# Patient Record
Sex: Female | Born: 1960 | Race: White | Hispanic: No | Smoking: Former smoker
Health system: Southern US, Community
[De-identification: ages and names within clinical notes are randomized; demographics above are authoritative.]

## PROBLEM LIST (undated history)

## (undated) DIAGNOSIS — M310 Hypersensitivity angiitis: Secondary | ICD-10-CM

## (undated) DIAGNOSIS — M898X9 Other specified disorders of bone, unspecified site: Secondary | ICD-10-CM

## (undated) DIAGNOSIS — M797 Fibromyalgia: Secondary | ICD-10-CM

## (undated) DIAGNOSIS — N809 Endometriosis, unspecified: Secondary | ICD-10-CM

## (undated) DIAGNOSIS — M7750 Other enthesopathy of unspecified foot: Secondary | ICD-10-CM

## (undated) DIAGNOSIS — I10 Essential (primary) hypertension: Secondary | ICD-10-CM

## (undated) DIAGNOSIS — E7800 Pure hypercholesterolemia, unspecified: Secondary | ICD-10-CM

## (undated) DIAGNOSIS — F319 Bipolar disorder, unspecified: Secondary | ICD-10-CM

## (undated) DIAGNOSIS — N186 End stage renal disease: Secondary | ICD-10-CM

## (undated) DIAGNOSIS — J449 Chronic obstructive pulmonary disease, unspecified: Secondary | ICD-10-CM

## (undated) DIAGNOSIS — M1990 Unspecified osteoarthritis, unspecified site: Secondary | ICD-10-CM

## (undated) HISTORY — DX: Other enthesopathy of unspecified foot and ankle: M77.50

## (undated) HISTORY — PX: TUBAL LIGATION: SHX77

## (undated) HISTORY — DX: Other specified disorders of bone, unspecified site: M89.8X9

## (undated) HISTORY — PX: CARPAL TUNNEL RELEASE: SHX101

## (undated) HISTORY — PX: BREAST LUMPECTOMY: SHX2

## (undated) HISTORY — PX: KNEE SURGERY: SHX244

## (undated) HISTORY — PX: CLAVICLE SURGERY: SHX598

## (undated) HISTORY — DX: Endometriosis, unspecified: N80.9

---

## 1993-06-21 HISTORY — PX: APPENDECTOMY: SHX54

## 2003-01-25 ENCOUNTER — Ambulatory Visit (HOSPITAL_BASED_OUTPATIENT_CLINIC_OR_DEPARTMENT_OTHER): Admission: RE | Admit: 2003-01-25 | Discharge: 2003-01-25 | Payer: Self-pay | Admitting: Orthopedic Surgery

## 2003-09-16 ENCOUNTER — Ambulatory Visit (HOSPITAL_COMMUNITY): Admission: RE | Admit: 2003-09-16 | Discharge: 2003-09-16 | Payer: Self-pay | Admitting: Orthopedic Surgery

## 2011-06-24 LAB — COMPREHENSIVE METABOLIC PANEL
Albumin: 3.4 g/dL (ref 3.4–5.0)
Alkaline Phosphatase: 103 U/L (ref 50–136)
Anion Gap: 6 — ABNORMAL LOW (ref 7–16)
Calcium, Total: 8.9 mg/dL (ref 8.5–10.1)
Creatinine: 0.86 mg/dL (ref 0.60–1.30)
EGFR (African American): >60
EGFR (Non-African Amer.): >60
Glucose: 121 mg/dL — ABNORMAL HIGH (ref 65–99)
Potassium: 3 mmol/L — ABNORMAL LOW (ref 3.5–5.1)
SGPT (ALT): 26 U/L
Sodium: 137 mmol/L (ref 136–145)
Total Protein: 8 g/dL (ref 6.4–8.2)

## 2011-06-24 LAB — URINALYSIS, COMPLETE
Glucose,UR: NEGATIVE mg/dL (ref 0–75)
Ketone: NEGATIVE
Leukocyte Esterase: NEGATIVE
Nitrite: NEGATIVE
Protein: NEGATIVE
RBC,UR: 1 /HPF (ref 0–5)
Specific Gravity: 1.005 (ref 1.003–1.030)
WBC UR: NONE SEEN /HPF (ref 0–5)

## 2011-06-24 LAB — DRUG SCREEN, URINE
Amphetamines, Ur Screen: NEGATIVE (ref ?–1000)
Barbiturates, Ur Screen: NEGATIVE (ref ?–200)
Cannabinoid 50 Ng, Ur ~~LOC~~: NEGATIVE (ref ?–50)
Cocaine Metabolite,Ur ~~LOC~~: NEGATIVE (ref ?–300)
MDMA (Ecstasy)Ur Screen: NEGATIVE (ref ?–500)
Opiate, Ur Screen: POSITIVE (ref ?–300)

## 2011-06-24 LAB — TSH: Thyroid Stimulating Horm: 0.463 u[IU]/mL

## 2011-06-25 ENCOUNTER — Inpatient Hospital Stay: Payer: Self-pay | Admitting: Psychiatry

## 2011-06-25 LAB — CBC
HCT: 40.5 % (ref 35.0–47.0)
HGB: 13.5 g/dL (ref 12.0–16.0)
MCH: 32.3 pg (ref 26.0–34.0)
MCHC: 33.4 g/dL (ref 32.0–36.0)
Platelet: 149 10*3/uL — ABNORMAL LOW (ref 150–440)
RBC: 4.2 10*6/uL (ref 3.80–5.20)

## 2011-06-26 LAB — BEHAVIORAL MEDICINE 1 PANEL
Albumin: 2.9 g/dL — ABNORMAL LOW (ref 3.4–5.0)
Alkaline Phosphatase: 87 U/L (ref 50–136)
Anion Gap: 7 (ref 7–16)
Basophil #: 0 10*3/uL (ref 0.0–0.1)
Basophil %: 0.9 %
Bilirubin,Total: 0.3 mg/dL (ref 0.2–1.0)
Creatinine: 0.92 mg/dL (ref 0.60–1.30)
Eosinophil #: 0.2 10*3/uL (ref 0.0–0.7)
Eosinophil %: 4.3 %
HGB: 13 g/dL (ref 12.0–16.0)
Lymphocyte #: 1.1 10*3/uL (ref 1.0–3.6)
MCH: 32.2 pg (ref 26.0–34.0)
MCHC: 33.4 g/dL (ref 32.0–36.0)
Monocyte #: 0.4 10*3/uL (ref 0.0–0.7)
Neutrophil #: 2.8 10*3/uL (ref 1.4–6.5)
Osmolality: 279 (ref 275–301)
Platelet: 146 10*3/uL — ABNORMAL LOW (ref 150–440)
Potassium: 3.3 mmol/L — ABNORMAL LOW (ref 3.5–5.1)
RBC: 4.03 10*6/uL (ref 3.80–5.20)
SGOT(AST): 27 U/L (ref 15–37)
Sodium: 140 mmol/L (ref 136–145)
Thyroid Stimulating Horm: 0.127 u[IU]/mL — ABNORMAL LOW
Total Protein: 7.2 g/dL (ref 6.4–8.2)
WBC: 4.6 10*3/uL (ref 3.6–11.0)

## 2011-06-28 LAB — WOUND CULTURE

## 2012-07-19 ENCOUNTER — Emergency Department (HOSPITAL_BASED_OUTPATIENT_CLINIC_OR_DEPARTMENT_OTHER): Payer: Self-pay

## 2012-07-19 ENCOUNTER — Encounter (HOSPITAL_BASED_OUTPATIENT_CLINIC_OR_DEPARTMENT_OTHER): Payer: Self-pay | Admitting: *Deleted

## 2012-07-19 ENCOUNTER — Emergency Department (HOSPITAL_BASED_OUTPATIENT_CLINIC_OR_DEPARTMENT_OTHER)
Admission: EM | Admit: 2012-07-19 | Discharge: 2012-07-19 | Disposition: A | Discharge status: Home / Self Care | Payer: Self-pay | Attending: Emergency Medicine | Admitting: Emergency Medicine

## 2012-07-19 DIAGNOSIS — F17200 Nicotine dependence, unspecified, uncomplicated: Secondary | ICD-10-CM | POA: Insufficient documentation

## 2012-07-19 DIAGNOSIS — Y92009 Unspecified place in unspecified non-institutional (private) residence as the place of occurrence of the external cause: Secondary | ICD-10-CM | POA: Insufficient documentation

## 2012-07-19 DIAGNOSIS — Z79899 Other long term (current) drug therapy: Secondary | ICD-10-CM | POA: Insufficient documentation

## 2012-07-19 DIAGNOSIS — S60229A Contusion of unspecified hand, initial encounter: Secondary | ICD-10-CM | POA: Insufficient documentation

## 2012-07-19 DIAGNOSIS — W010XXA Fall on same level from slipping, tripping and stumbling without subsequent striking against object, initial encounter: Secondary | ICD-10-CM | POA: Insufficient documentation

## 2012-07-19 DIAGNOSIS — Y939 Activity, unspecified: Secondary | ICD-10-CM | POA: Insufficient documentation

## 2012-07-19 MED ORDER — OXYCODONE-ACETAMINOPHEN 5-325 MG PO TABS: 1.0000 | ORAL_TABLET | Freq: Four times a day (QID) | ORAL | Status: DC | PRN

## 2012-07-19 NOTE — ED Provider Notes (Signed)
History     CSN: [id number]  Arrival date & time 07/19/12  13   First MD Initiated Contact with Patient 07/19/12 1522      Chief Complaint  Patient presents with  . Fall    (Consider location/radiation/quality/duration/timing/severity/associated sxs/prior treatment) HPI Pt reports she fell in her bathtub yesterday injuring her R hand. Complaining of moderate aching pain worse with movement.   History reviewed. No pertinent past medical history.  Past Surgical History  Procedure Date  . Joint replacement   . Carpal tunnel release   . Appendectomy   . Breast lumpectomy   . Tubal ligation     History reviewed. No pertinent family history.  History  Substance Use Topics  . Smoking status: Current Every Day Smoker -- 0.5 packs/day    Types: Cigarettes  . Smokeless tobacco: Not on file  . Alcohol Use: No    OB History    Grav Para Term Preterm Abortions TAB SAB Ect Mult Living                  Review of Systems All other systems reviewed and are negative except as noted in HPI.   Allergies  Asa; Doxycycline; Geodon; Hydrocodone; Morphine and related; Penicillins; Prozac; Tetracyclines & related; and Zoloft  Home Medications   Current Outpatient Rx  Name  Route  Sig  Dispense  Refill  . DULOXETINE HCL 60 MG PO CPEP   Oral   Take 60 mg by mouth daily.         Marland Kitchen HYDROCHLOROTHIAZIDE 25 MG PO TABS   Oral   Take 25 mg by mouth daily.         Marland Kitchen LISINOPRIL 10 MG PO TABS   Oral   Take 10 mg by mouth daily.         Marland Kitchen ROSUVASTATIN CALCIUM 10 MG PO TABS   Oral   Take 10 mg by mouth daily.           BP 98/60  Pulse 100  Temp 97.5 F (36.4 C) (Oral)  Resp 16  Ht 5\' 1"  (1.549 m)  Wt 200 lb (90.719 kg)  BMI 37.79 kg/m2  SpO2 96%  Physical Exam  Constitutional: She is oriented to person, place, and time. She appears well-developed and well-nourished.  HENT:  Head: Normocephalic and atraumatic.  Neck: Neck supple.  Pulmonary/Chest: Effort  normal.  Musculoskeletal: She exhibits tenderness.       Tenderness and ecchymosis of the dorsal R hand, no wrist or snuff box tenderness  Neurological: She is alert and oriented to person, place, and time. No cranial nerve deficit.  Psychiatric: She has a normal mood and affect. Her behavior is normal.    ED Course  Procedures (including critical care time)  Labs Reviewed - No data to display Dg Hand Complete Right  07/19/2012  *RADIOLOGY REPORT*  Clinical Data: [place].  Injured right hand.  RIGHT HAND - COMPLETE 3+ VIEW  Comparison: None  Findings: The joint spaces are maintained.  No acute fracture.  IMPRESSION: No acute bony findings.   Original Report Authenticated By: Marijo Sanes, M.D.      No diagnosis found.    MDM  Xray neg. Soft tissue injury without underlying bony injury. ACE wrap, RICE, pain meds as needed.         Charles B. Karle Starch, MD 07/19/12 1546

## 2012-07-19 NOTE — ED Notes (Signed)
Pt c/o fall out of bathtub c/o right hand pain

## 2012-09-01 ENCOUNTER — Encounter (HOSPITAL_BASED_OUTPATIENT_CLINIC_OR_DEPARTMENT_OTHER): Payer: Self-pay | Admitting: *Deleted

## 2012-09-01 ENCOUNTER — Emergency Department (HOSPITAL_BASED_OUTPATIENT_CLINIC_OR_DEPARTMENT_OTHER): Payer: Self-pay

## 2012-09-01 ENCOUNTER — Emergency Department (HOSPITAL_BASED_OUTPATIENT_CLINIC_OR_DEPARTMENT_OTHER)
Admission: EM | Admit: 2012-09-01 | Discharge: 2012-09-01 | Disposition: A | Discharge status: Home / Self Care | Payer: Self-pay | Attending: Emergency Medicine | Admitting: Emergency Medicine

## 2012-09-01 DIAGNOSIS — M7989 Other specified soft tissue disorders: Secondary | ICD-10-CM | POA: Insufficient documentation

## 2012-09-01 DIAGNOSIS — F17200 Nicotine dependence, unspecified, uncomplicated: Secondary | ICD-10-CM | POA: Insufficient documentation

## 2012-09-01 DIAGNOSIS — M129 Arthropathy, unspecified: Secondary | ICD-10-CM | POA: Insufficient documentation

## 2012-09-01 DIAGNOSIS — Z79899 Other long term (current) drug therapy: Secondary | ICD-10-CM | POA: Insufficient documentation

## 2012-09-01 DIAGNOSIS — M1990 Unspecified osteoarthritis, unspecified site: Secondary | ICD-10-CM

## 2012-09-01 DIAGNOSIS — M25469 Effusion, unspecified knee: Secondary | ICD-10-CM | POA: Insufficient documentation

## 2012-09-01 MED ORDER — IBUPROFEN 800 MG PO TABS: 800.0000 mg | ORAL_TABLET | Freq: Three times a day (TID) | ORAL | Status: AC

## 2012-09-01 MED ORDER — OXYCODONE-ACETAMINOPHEN 5-325 MG PO TABS: 2.0000 | ORAL_TABLET | ORAL | Status: DC | PRN

## 2012-09-01 NOTE — ED Notes (Signed)
Pt states that her right foot is going numb. Pt. Has good capillary refill in right foot, and the right foot is slightly swollen.

## 2012-09-01 NOTE — ED Notes (Signed)
Pt questioning Vicente Males, RN regarding how long it will be until they are seen. PA informed.

## 2012-09-01 NOTE — ED Notes (Signed)
PA at bedside.

## 2012-09-01 NOTE — ED Notes (Signed)
Knee sleeve applied by Ander Purpura, EMT.

## 2012-09-01 NOTE — ED Notes (Signed)
Right knee is swollen. States it swells off and on. She may have twisted it last week.

## 2012-09-01 NOTE — ED Provider Notes (Signed)
History     CSN: [id number]  Arrival date & time 09/01/12  1437   First MD Initiated Contact with Patient 09/01/12 [phone]      Chief Complaint  Patient presents with  . Knee Pain    (Consider location/radiation/quality/duration/timing/severity/associated sxs/prior treatment) Patient is a 51 y.o. female presenting with knee pain.  Knee Pain Location:  Knee Time since incident:  3 months Lower extremity injury: may have twisted.   Knee location:  R knee Pain details:    Quality:  Aching   Radiates to:  Does not radiate   Severity:  Mild   Onset quality:  Sudden   Timing:  Constant   Progression:  Worsening Chronicity:  New Dislocation: no   Prior injury to area:  Yes Relieved by:  Nothing Worsened by:  Activity Pt reports she has had knee pain her whole life  History reviewed. No pertinent past medical history.  Past Surgical History  Procedure Laterality Date  . Joint replacement    . Carpal tunnel release    . Appendectomy    . Breast lumpectomy    . Tubal ligation      No family history on file.  History  Substance Use Topics  . Smoking status: Current Every Day Smoker -- 0.50 packs/day    Types: Cigarettes  . Smokeless tobacco: Not on file  . Alcohol Use: No    OB History   Grav Para Term Preterm Abortions TAB SAB Ect Mult Living                  Review of Systems  Cardiovascular: Positive for leg swelling.  All other systems reviewed and are negative.    Allergies  Asa; Doxycycline; Geodon; Hydrocodone; Morphine and related; Penicillins; Prozac; Tetracyclines & related; and Zoloft  Home Medications   Current Outpatient Rx  Name  Route  Sig  Dispense  Refill  . DULoxetine (CYMBALTA) 60 MG capsule   Oral   Take 60 mg by mouth daily.         . hydrochlorothiazide (HYDRODIURIL) 25 MG tablet   Oral   Take 25 mg by mouth daily.         Marland Kitchen lisinopril (PRINIVIL,ZESTRIL) 10 MG tablet   Oral   Take 10 mg by mouth daily.         Marland Kitchen  oxyCODONE-acetaminophen (PERCOCET/ROXICET) 5-325 MG per tablet   Oral   Take 1-2 tablets by mouth every 6 (six) hours as needed for pain.   20 tablet   0   . rosuvastatin (CRESTOR) 10 MG tablet   Oral   Take 10 mg by mouth daily.           BP 125/73  Pulse 102  Temp(Src) 97.8 F (36.6 C) (Oral)  Resp 20  Wt 200 lb (90.719 kg)  BMI 37.81 kg/m2  SpO2 98%  Physical Exam  Vitals reviewed. Constitutional: She is oriented to person, place, and time. She appears well-developed and well-nourished.  HENT:  Head: Normocephalic and atraumatic.  Musculoskeletal: She exhibits tenderness. She exhibits no edema.  Neurological: She is alert and oriented to person, place, and time. She has normal reflexes.  Skin: Skin is warm.  Psychiatric: She has a normal mood and affect.    ED Course  Procedures (including critical care time)  Labs Reviewed - No data to display Dg Knee Complete 4 Views Right  09/01/2012  *RADIOLOGY REPORT*  Clinical Data: History of twisting injury several days previously. History of  pain.  RIGHT KNEE - COMPLETE 4+ VIEW  Comparison: None.  Findings: On the lateral image there is some density in the suprapatellar region which may reflect a small amount of joint effusion.  There is a small marginal osteophyte arising from the posterior inferior aspect of the patella.  There is minimal lateral and medial spurring present of distal femur and proximal tibia. No fracture or bony destruction is seen.  No chondrocalcinosis or opaque loose body is evident.  IMPRESSION: Small joint effusion.  No fracture or dislocation.  Minimal degenerative spurring.   Original Report Authenticated By: Shanon Brow Call      No diagnosis found.    MDM  Small effusion,   Pt counseled on spurring and swelling.   Pt placed in a knee sleeve.   Pt referred to Dr. Sharol Given for evaluation        Fransico Meadow, PA-C 09/01/12 1650

## 2012-09-02 NOTE — ED Provider Notes (Signed)
Medical screening examination/treatment/procedure(s) were performed by non-physician practitioner and as supervising physician I was immediately available for consultation/collaboration.   Mylinda Latina III, MD 09/02/12 [phone]

## 2012-11-07 ENCOUNTER — Encounter (HOSPITAL_BASED_OUTPATIENT_CLINIC_OR_DEPARTMENT_OTHER): Payer: Self-pay | Admitting: Family Medicine

## 2012-11-07 ENCOUNTER — Emergency Department (HOSPITAL_BASED_OUTPATIENT_CLINIC_OR_DEPARTMENT_OTHER)
Admission: EM | Admit: 2012-11-07 | Discharge: 2012-11-07 | Disposition: A | Discharge status: Home / Self Care | Payer: Self-pay | Attending: Emergency Medicine | Admitting: Emergency Medicine

## 2012-11-07 ENCOUNTER — Emergency Department (HOSPITAL_BASED_OUTPATIENT_CLINIC_OR_DEPARTMENT_OTHER): Payer: Self-pay

## 2012-11-07 DIAGNOSIS — IMO0001 Reserved for inherently not codable concepts without codable children: Secondary | ICD-10-CM | POA: Insufficient documentation

## 2012-11-07 DIAGNOSIS — F17200 Nicotine dependence, unspecified, uncomplicated: Secondary | ICD-10-CM | POA: Insufficient documentation

## 2012-11-07 DIAGNOSIS — Z79899 Other long term (current) drug therapy: Secondary | ICD-10-CM | POA: Insufficient documentation

## 2012-11-07 DIAGNOSIS — R0781 Pleurodynia: Secondary | ICD-10-CM

## 2012-11-07 DIAGNOSIS — R05 Cough: Secondary | ICD-10-CM | POA: Insufficient documentation

## 2012-11-07 DIAGNOSIS — R0602 Shortness of breath: Secondary | ICD-10-CM | POA: Insufficient documentation

## 2012-11-07 DIAGNOSIS — Z880 Allergy status to penicillin: Secondary | ICD-10-CM | POA: Insufficient documentation

## 2012-11-07 DIAGNOSIS — R0789 Other chest pain: Secondary | ICD-10-CM | POA: Insufficient documentation

## 2012-11-07 DIAGNOSIS — Z8739 Personal history of other diseases of the musculoskeletal system and connective tissue: Secondary | ICD-10-CM | POA: Insufficient documentation

## 2012-11-07 DIAGNOSIS — Z791 Long term (current) use of non-steroidal anti-inflammatories (NSAID): Secondary | ICD-10-CM | POA: Insufficient documentation

## 2012-11-07 DIAGNOSIS — J441 Chronic obstructive pulmonary disease with (acute) exacerbation: Secondary | ICD-10-CM | POA: Insufficient documentation

## 2012-11-07 DIAGNOSIS — I10 Essential (primary) hypertension: Secondary | ICD-10-CM | POA: Insufficient documentation

## 2012-11-07 DIAGNOSIS — R059 Cough, unspecified: Secondary | ICD-10-CM | POA: Insufficient documentation

## 2012-11-07 HISTORY — DX: Unspecified osteoarthritis, unspecified site: M19.90

## 2012-11-07 HISTORY — DX: Fibromyalgia: M79.7

## 2012-11-07 HISTORY — DX: Chronic obstructive pulmonary disease, unspecified: J44.9

## 2012-11-07 HISTORY — DX: Essential (primary) hypertension: I10

## 2012-11-07 MED ORDER — OXYCODONE-ACETAMINOPHEN 5-325 MG PO TABS: 2.0000 | ORAL_TABLET | ORAL | Status: DC | PRN

## 2012-11-07 MED ORDER — OXYCODONE-ACETAMINOPHEN 5-325 MG PO TABS
2.0000 | ORAL_TABLET | Freq: Once | ORAL | Status: AC
  Administered 2012-11-07: 2 via ORAL
  Filled 2012-11-07 (×2): qty 2

## 2012-11-07 NOTE — ED Notes (Addendum)
Pt c/o left sided rib pain and sts "I think i may have broken a rib from coughing". Pt sts she is having to splint side with cough and is out of pain meds.

## 2012-11-07 NOTE — ED Provider Notes (Signed)
History     CSN: [id number]  Arrival date & time 11/07/12  1225   First MD Initiated Contact with Patient 11/07/12 1237      Chief Complaint  Patient presents with  . left side rib pain     (Consider location/radiation/quality/duration/timing/severity/associated sxs/prior treatment) HPI Comments: Patient complains of left-sided rib pain for the past 2 days. She states she has COPD and has been coughing a lot over the past week. Cough is nonproductive. Denies chest pain, fever, abdominal pain, nausea or vomiting. She's not take anything at home for the pain. Denies any leg pain or swelling. Denies any falls or trauma.  The history is provided by the patient.    Past Medical History  Diagnosis Date  . Fibromyalgia   . COPD (chronic obstructive pulmonary disease)   . Arthritis   . Hypertension     Past Surgical History  Procedure Laterality Date  . Joint replacement    . Carpal tunnel release    . Appendectomy    . Breast lumpectomy    . Tubal ligation      No family history on file.  History  Substance Use Topics  . Smoking status: Current Every Day Smoker -- 0.50 packs/day    Types: Cigarettes  . Smokeless tobacco: Not on file  . Alcohol Use: No    OB History   Grav Para Term Preterm Abortions TAB SAB Ect Mult Living                  Review of Systems  Constitutional: Negative for fever, activity change and appetite change.  HENT: Negative for congestion and rhinorrhea.   Eyes: Negative for visual disturbance.  Respiratory: Positive for cough and shortness of breath. Negative for chest tightness.   Cardiovascular: Negative for chest pain.  Gastrointestinal: Negative for nausea, vomiting and abdominal pain.  Genitourinary: Negative for dysuria and hematuria.  Musculoskeletal: Positive for arthralgias. Negative for back pain.  Skin: Negative for wound.  Neurological: Negative for dizziness and headaches.  A complete 10 system review of systems was obtained  and all systems are negative except as noted in the HPI and PMH.    Allergies  Asa; Doxycycline; Geodon; Hydrocodone; Morphine and related; Penicillins; Prozac; Tetracyclines & related; Tramadol; and Zoloft  Home Medications   Current Outpatient Rx  Name  Route  Sig  Dispense  Refill  . albuterol (PROVENTIL HFA;VENTOLIN HFA) 108 (90 BASE) MCG/ACT inhaler   Inhalation   Inhale 2 puffs into the lungs every 6 (six) hours as needed for wheezing.         . DULoxetine (CYMBALTA) 60 MG capsule   Oral   Take 60 mg by mouth daily.         . hydrochlorothiazide (HYDRODIURIL) 25 MG tablet   Oral   Take 25 mg by mouth daily.         Marland Kitchen ibuprofen (ADVIL,MOTRIN) 800 MG tablet   Oral   Take 1 tablet (800 mg total) by mouth 3 (three) times daily.   21 tablet   0   . lisinopril (PRINIVIL,ZESTRIL) 10 MG tablet   Oral   Take 10 mg by mouth daily.         Marland Kitchen oxyCODONE-acetaminophen (PERCOCET/ROXICET) 5-325 MG per tablet   Oral   Take 1-2 tablets by mouth every 6 (six) hours as needed for pain.   20 tablet   0   . oxyCODONE-acetaminophen (PERCOCET/ROXICET) 5-325 MG per tablet   Oral  Take 2 tablets by mouth every 4 (four) hours as needed for pain.   10 tablet   0   . rosuvastatin (CRESTOR) 10 MG tablet   Oral   Take 10 mg by mouth daily.           BP 134/90  Pulse 89  Temp(Src) 98.4 F (36.9 C) (Oral)  Resp 18  SpO2 100%  Physical Exam  Constitutional: She is oriented to person, place, and time. She appears well-developed and well-nourished. No distress.  HENT:  Head: Normocephalic and atraumatic.  Mouth/Throat: Oropharynx is clear and moist. No oropharyngeal exudate.  Eyes: Conjunctivae and EOM are normal. Pupils are equal, round, and reactive to light.  Neck: Normal range of motion. Neck supple.  Cardiovascular: Normal rate, regular rhythm and normal heart sounds.   No murmur heard. Pulmonary/Chest: Effort normal and breath sounds normal. No respiratory  distress. She exhibits tenderness.  Left-sided chest tenderness underneath breast, no rash  Abdominal: Soft. There is no tenderness. There is no rebound and no guarding.  Musculoskeletal: Normal range of motion. She exhibits no edema and no tenderness.  Neurological: She is alert and oriented to person, place, and time. No cranial nerve deficit. She exhibits normal muscle tone. Coordination normal.  Skin: Skin is warm.    ED Course  Procedures (including critical care time)  Labs Reviewed - No data to display Dg Chest 2 View  11/07/2012   *RADIOLOGY REPORT*  Clinical Data: Cough, pain  CHEST - 2 VIEW  Comparison: September 10, 2003.  Findings: Cardiomediastinal silhouette appears normal.  No acute pulmonary disease is noted.  Bony thorax is intact.  IMPRESSION: No acute cardiopulmonary abnormality seen.   Original Report Authenticated By: Marijo Conception.,  M.D.     No diagnosis found.    MDM  Left-sided rib pain after coughing. Denies shortness of breath or anterior chest pain. No fever or chills no leg pain or swelling.  Left chest wall tender without rash. No hypoxia, no tachycardia.  X-ray negative for fracture. No pneumothorax. We'll treat symptomatically with incentive spirometry and pain control. Followup with PCP.   Ezequiel Essex, MD 11/07/12 [phone]

## 2013-01-01 ENCOUNTER — Emergency Department (HOSPITAL_BASED_OUTPATIENT_CLINIC_OR_DEPARTMENT_OTHER)
Admission: EM | Admit: 2013-01-01 | Discharge: 2013-01-01 | Disposition: A | Discharge status: Home / Self Care | Payer: Self-pay | Attending: Emergency Medicine | Admitting: Emergency Medicine

## 2013-01-01 ENCOUNTER — Encounter (HOSPITAL_BASED_OUTPATIENT_CLINIC_OR_DEPARTMENT_OTHER): Payer: Self-pay

## 2013-01-01 DIAGNOSIS — I10 Essential (primary) hypertension: Secondary | ICD-10-CM | POA: Insufficient documentation

## 2013-01-01 DIAGNOSIS — F17200 Nicotine dependence, unspecified, uncomplicated: Secondary | ICD-10-CM | POA: Insufficient documentation

## 2013-01-01 DIAGNOSIS — Z79899 Other long term (current) drug therapy: Secondary | ICD-10-CM | POA: Insufficient documentation

## 2013-01-01 DIAGNOSIS — J4489 Other specified chronic obstructive pulmonary disease: Secondary | ICD-10-CM | POA: Insufficient documentation

## 2013-01-01 DIAGNOSIS — E7800 Pure hypercholesterolemia, unspecified: Secondary | ICD-10-CM | POA: Insufficient documentation

## 2013-01-01 DIAGNOSIS — Z8739 Personal history of other diseases of the musculoskeletal system and connective tissue: Secondary | ICD-10-CM | POA: Insufficient documentation

## 2013-01-01 DIAGNOSIS — Z880 Allergy status to penicillin: Secondary | ICD-10-CM | POA: Insufficient documentation

## 2013-01-01 DIAGNOSIS — M5412 Radiculopathy, cervical region: Secondary | ICD-10-CM | POA: Insufficient documentation

## 2013-01-01 DIAGNOSIS — J449 Chronic obstructive pulmonary disease, unspecified: Secondary | ICD-10-CM | POA: Insufficient documentation

## 2013-01-01 HISTORY — DX: Pure hypercholesterolemia, unspecified: E78.00

## 2013-01-01 MED ORDER — OXYCODONE-ACETAMINOPHEN 5-325 MG PO TABS: 1.0000 | ORAL_TABLET | ORAL | Status: DC | PRN

## 2013-01-01 MED ORDER — CYCLOBENZAPRINE HCL 10 MG PO TABS: 10.0000 mg | ORAL_TABLET | Freq: Three times a day (TID) | ORAL | Status: DC | PRN

## 2013-01-01 NOTE — ED Provider Notes (Signed)
History    CSN: [id number] Arrival date & time 01/01/13  [id number]  First MD Initiated Contact with Patient 01/01/13 [phone]     Chief Complaint  Patient presents with  . Neck Pain   (Consider location/radiation/quality/duration/timing/severity/associated sxs/prior Treatment) HPI This is a 52 year old female with a 6 week history of right-sided neck pain that acutely worsened about 3 days ago. The pain is now severe and worse with movement of neck, notably on rotation to the right. The pain limits her range of motion notably rotation to the right. The pain is sharp and radiates down her right arm and about the C7 dermatome. There is some associated paresthesias but no gross motor deficit. She denies injury.   Past Medical History  Diagnosis Date  . Fibromyalgia   . COPD (chronic obstructive pulmonary disease)   . Arthritis   . Hypertension   . High cholesterol    Past Surgical History  Procedure Laterality Date  . Joint replacement    . Carpal tunnel release    . Appendectomy    . Breast lumpectomy    . Tubal ligation     No family history on file. History  Substance Use Topics  . Smoking status: Current Every Day Smoker -- 0.50 packs/day    Types: Cigarettes  . Smokeless tobacco: Not on file  . Alcohol Use: No   OB History   Grav Para Term Preterm Abortions TAB SAB Ect Mult Living                 Review of Systems  All other systems reviewed and are negative.    Allergies  Asa; Doxycycline; Geodon; Hydrocodone; Morphine and related; Penicillins; Prozac; Tetracyclines & related; Tramadol; and Zoloft  Home Medications   Current Outpatient Rx  Name  Route  Sig  Dispense  Refill  . albuterol (PROVENTIL HFA;VENTOLIN HFA) 108 (90 BASE) MCG/ACT inhaler   Inhalation   Inhale 2 puffs into the lungs every 6 (six) hours as needed for wheezing.         . DULoxetine (CYMBALTA) 60 MG capsule   Oral   Take 60 mg by mouth daily.         . hydrochlorothiazide  (HYDRODIURIL) 25 MG tablet   Oral   Take 25 mg by mouth daily.         Marland Kitchen ibuprofen (ADVIL,MOTRIN) 800 MG tablet   Oral   Take 1 tablet (800 mg total) by mouth 3 (three) times daily.   21 tablet   0   . lisinopril (PRINIVIL,ZESTRIL) 10 MG tablet   Oral   Take 10 mg by mouth daily.         Marland Kitchen oxyCODONE-acetaminophen (PERCOCET/ROXICET) 5-325 MG per tablet   Oral   Take 1-2 tablets by mouth every 6 (six) hours as needed for pain.   20 tablet   0   . oxyCODONE-acetaminophen (PERCOCET/ROXICET) 5-325 MG per tablet   Oral   Take 2 tablets by mouth every 4 (four) hours as needed for pain.   10 tablet   0   . oxyCODONE-acetaminophen (PERCOCET/ROXICET) 5-325 MG per tablet   Oral   Take 2 tablets by mouth every 4 (four) hours as needed for pain.   15 tablet   0   . rosuvastatin (CRESTOR) 10 MG tablet   Oral   Take 10 mg by mouth daily.          BP 165/87  Pulse 87  Temp(Src) 97.8 F (36.6 C) (  Oral)  Resp 20  SpO2 98%  Physical Exam General: Well-developed, well-nourished female in no acute distress; appearance consistent with age of record HENT: normocephalic, atraumatic Eyes: pupils equal round and reactive to light; extraocular muscles intact Neck: Decreased range of motion notably on rotation to the right with pain on rotation of the right; right posterolateral tenderness Heart: regular rate and rhythm Lungs: Normal respiratory effort and excursion Abdomen: soft; obese Extremities: No deformity; full range of motion; mild triggering of right middle finger Neurologic: Awake, alert and oriented; motor function intact in all extremities and symmetric; no facial droop Skin: Warm and dry Psychiatric: Tearful    ED Course  Procedures (including critical care time)    MDM  We'll refer to neurosurgery.  Wynetta Fines, MD 01/01/13 [phone]

## 2013-01-01 NOTE — ED Notes (Signed)
Patient complains of right sided neck pain with radiation down right arm. Reports symptoms started 6 weeks ago with numbness intermittently to same. Reports extreme pain when she attempts to turn head to right side. deniesinjury

## 2013-01-10 ENCOUNTER — Emergency Department (HOSPITAL_BASED_OUTPATIENT_CLINIC_OR_DEPARTMENT_OTHER)
Admission: EM | Admit: 2013-01-10 | Discharge: 2013-01-10 | Disposition: A | Discharge status: Home / Self Care | Payer: Self-pay | Attending: Emergency Medicine | Admitting: Emergency Medicine

## 2013-01-10 ENCOUNTER — Encounter (HOSPITAL_BASED_OUTPATIENT_CLINIC_OR_DEPARTMENT_OTHER): Payer: Self-pay | Admitting: *Deleted

## 2013-01-10 DIAGNOSIS — E7800 Pure hypercholesterolemia, unspecified: Secondary | ICD-10-CM | POA: Insufficient documentation

## 2013-01-10 DIAGNOSIS — Z79899 Other long term (current) drug therapy: Secondary | ICD-10-CM | POA: Insufficient documentation

## 2013-01-10 DIAGNOSIS — F17200 Nicotine dependence, unspecified, uncomplicated: Secondary | ICD-10-CM | POA: Insufficient documentation

## 2013-01-10 DIAGNOSIS — Z8739 Personal history of other diseases of the musculoskeletal system and connective tissue: Secondary | ICD-10-CM | POA: Insufficient documentation

## 2013-01-10 DIAGNOSIS — M5412 Radiculopathy, cervical region: Secondary | ICD-10-CM | POA: Insufficient documentation

## 2013-01-10 DIAGNOSIS — J4489 Other specified chronic obstructive pulmonary disease: Secondary | ICD-10-CM | POA: Insufficient documentation

## 2013-01-10 DIAGNOSIS — Z880 Allergy status to penicillin: Secondary | ICD-10-CM | POA: Insufficient documentation

## 2013-01-10 DIAGNOSIS — J449 Chronic obstructive pulmonary disease, unspecified: Secondary | ICD-10-CM | POA: Insufficient documentation

## 2013-01-10 DIAGNOSIS — I10 Essential (primary) hypertension: Secondary | ICD-10-CM | POA: Insufficient documentation

## 2013-01-10 MED ORDER — CYCLOBENZAPRINE HCL 10 MG PO TABS: 10.0000 mg | ORAL_TABLET | Freq: Three times a day (TID) | ORAL | Status: AC | PRN

## 2013-01-10 MED ORDER — OXYCODONE-ACETAMINOPHEN 5-325 MG PO TABS
2.0000 | ORAL_TABLET | Freq: Once | ORAL | Status: AC
  Administered 2013-01-10: 2 via ORAL
  Filled 2013-01-10 (×2): qty 2

## 2013-01-10 MED ORDER — OXYCODONE-ACETAMINOPHEN 5-325 MG PO TABS: 1.0000 | ORAL_TABLET | ORAL | Status: DC | PRN

## 2013-01-10 NOTE — ED Provider Notes (Signed)
History    CSN: [id number] Arrival date & time 01/10/13  [id number]  First MD Initiated Contact with Patient 01/10/13 0518     Chief Complaint  Patient presents with  . Neck Pain   (Consider location/radiation/quality/duration/timing/severity/associated sxs/prior Treatment) HPI This is a 52 year old female with a history of right-sided neck pain radiating down her right arm and back. She was seen by myself on the 14th of this month and diagnosed with cervical radiculopathy. She was referred to neurosurgery but states she is to followup due to financial reasons. She is awaiting her Medicare to "kick in" August 1.  She is here both because she has run out of pain medication and also because she is having pain in her right upper back that she did not have previously. She states it feels like a muscle spasm. She continues to have pain in her neck that is severe at times but moderate present time. Her range of motion is somewhat improved since her previous visit. She has not had any worsening neurologic symptoms.  Past Medical History  Diagnosis Date  . Fibromyalgia   . COPD (chronic obstructive pulmonary disease)   . Arthritis   . Hypertension   . High cholesterol    Past Surgical History  Procedure Laterality Date  . Joint replacement    . Carpal tunnel release    . Appendectomy    . Breast lumpectomy    . Tubal ligation     No family history on file. History  Substance Use Topics  . Smoking status: Current Every Day Smoker -- 0.50 packs/day    Types: Cigarettes  . Smokeless tobacco: Not on file  . Alcohol Use: No   OB History   Grav Para Term Preterm Abortions TAB SAB Ect Mult Living                 Review of Systems  All other systems reviewed and are negative.    Allergies  Asa; Doxycycline; Geodon; Hydrocodone; Morphine and related; Penicillins; Prozac; Tetracyclines & related; Tramadol; and Zoloft  Home Medications   Current Outpatient Rx  Name  Route  Sig   Dispense  Refill  . albuterol (PROVENTIL HFA;VENTOLIN HFA) 108 (90 BASE) MCG/ACT inhaler   Inhalation   Inhale 2 puffs into the lungs every 6 (six) hours as needed for wheezing.         . cyclobenzaprine (FLEXERIL) 10 MG tablet   Oral   Take 1 tablet (10 mg total) by mouth 3 (three) times daily as needed for muscle spasms.   20 tablet   0   . DULoxetine (CYMBALTA) 60 MG capsule   Oral   Take 60 mg by mouth daily.         . hydrochlorothiazide (HYDRODIURIL) 25 MG tablet   Oral   Take 25 mg by mouth daily.         Marland Kitchen ibuprofen (ADVIL,MOTRIN) 800 MG tablet   Oral   Take 1 tablet (800 mg total) by mouth 3 (three) times daily.   21 tablet   0   . lisinopril (PRINIVIL,ZESTRIL) 10 MG tablet   Oral   Take 10 mg by mouth daily.         Marland Kitchen oxyCODONE-acetaminophen (PERCOCET) 5-325 MG per tablet   Oral   Take 1-2 tablets by mouth every 4 (four) hours as needed for pain.   30 tablet   0   . oxyCODONE-acetaminophen (PERCOCET/ROXICET) 5-325 MG per tablet   Oral  Take 1-2 tablets by mouth every 6 (six) hours as needed for pain.   20 tablet   0   . oxyCODONE-acetaminophen (PERCOCET/ROXICET) 5-325 MG per tablet   Oral   Take 2 tablets by mouth every 4 (four) hours as needed for pain.   10 tablet   0   . oxyCODONE-acetaminophen (PERCOCET/ROXICET) 5-325 MG per tablet   Oral   Take 2 tablets by mouth every 4 (four) hours as needed for pain.   15 tablet   0   . rosuvastatin (CRESTOR) 10 MG tablet   Oral   Take 10 mg by mouth daily.          BP 126/81  Pulse 88  Temp(Src) 98.4 F (36.9 C) (Oral)  Resp 18  Ht 5\' 1"  (1.549 m)  Wt 200 lb (90.719 kg)  BMI 37.81 kg/m2  SpO2 99%  Physical Exam General: Well-developed, well-nourished female in no acute distress; appearance consistent with age of record HENT: normocephalic, atraumatic Eyes: pupils equal round and reactive to light; extraocular muscles intact Neck: supple but limited range of motion of the right; pain  on rotation to the right Heart: regular rate and rhythm Lungs: Normal respiratory effort and excursion Abdomen: soft; obese Back: Right parathoracic muscle spasms Extremities: No deformity; full range of motion Neurologic: Awake, alert and oriented; motor function intact in all extremities and symmetric; no facial droop Skin: Warm and dry Psychiatric: Normal mood and affect    ED Course  Procedures (including critical care time)   MDM  Patient was advised that the ED is that the appropriate venue for this type of pain. She was advised to followup with her primary care physician. She is also be given a referral to neurosurgery and she was advised that this legally obligates them to see her under EMTALA.  Karen Chafe Alexx Mcburney, MD 01/10/13 0530

## 2013-01-10 NOTE — ED Notes (Signed)
Pt c/o right side neck pain that radiates down her right arm and back. Pt seen here for same 1 week ago. Pt unable to follow up with neuro due to lack of insurance.

## 2013-04-03 ENCOUNTER — Ambulatory Visit: Payer: Medicaid Other | Attending: Neurosurgery | Admitting: Physical Therapy

## 2013-04-03 DIAGNOSIS — M542 Cervicalgia: Secondary | ICD-10-CM | POA: Insufficient documentation

## 2013-04-03 DIAGNOSIS — M6281 Muscle weakness (generalized): Secondary | ICD-10-CM | POA: Insufficient documentation

## 2013-04-03 DIAGNOSIS — M25519 Pain in unspecified shoulder: Secondary | ICD-10-CM | POA: Insufficient documentation

## 2013-04-03 DIAGNOSIS — IMO0001 Reserved for inherently not codable concepts without codable children: Secondary | ICD-10-CM | POA: Insufficient documentation

## 2013-04-03 DIAGNOSIS — R293 Abnormal posture: Secondary | ICD-10-CM | POA: Insufficient documentation

## 2013-04-10 ENCOUNTER — Ambulatory Visit: Payer: Medicaid Other | Admitting: Physical Therapy

## 2013-04-13 ENCOUNTER — Ambulatory Visit: Payer: Medicaid Other | Admitting: Rehabilitation

## 2013-04-17 ENCOUNTER — Ambulatory Visit: Payer: Medicaid Other | Admitting: Physical Therapy

## 2013-04-20 ENCOUNTER — Ambulatory Visit: Payer: Medicaid Other | Admitting: Physical Therapy

## 2013-04-24 ENCOUNTER — Ambulatory Visit: Payer: Medicaid Other | Admitting: Rehabilitation

## 2013-04-27 ENCOUNTER — Ambulatory Visit: Payer: Medicaid Other | Attending: Neurosurgery | Admitting: Rehabilitation

## 2013-04-27 DIAGNOSIS — M6281 Muscle weakness (generalized): Secondary | ICD-10-CM | POA: Insufficient documentation

## 2013-04-27 DIAGNOSIS — IMO0001 Reserved for inherently not codable concepts without codable children: Secondary | ICD-10-CM | POA: Insufficient documentation

## 2013-04-27 DIAGNOSIS — M25519 Pain in unspecified shoulder: Secondary | ICD-10-CM | POA: Insufficient documentation

## 2013-04-27 DIAGNOSIS — M542 Cervicalgia: Secondary | ICD-10-CM | POA: Insufficient documentation

## 2013-04-27 DIAGNOSIS — R293 Abnormal posture: Secondary | ICD-10-CM | POA: Insufficient documentation

## 2013-05-01 ENCOUNTER — Ambulatory Visit: Payer: Medicaid Other | Admitting: Physical Therapy

## 2013-05-04 ENCOUNTER — Ambulatory Visit: Payer: Medicaid Other | Admitting: Physical Therapy

## 2013-05-08 ENCOUNTER — Ambulatory Visit: Payer: Medicaid Other | Admitting: Physical Therapy

## 2013-05-11 ENCOUNTER — Ambulatory Visit: Payer: Medicaid Other | Admitting: Rehabilitation

## 2013-05-15 ENCOUNTER — Ambulatory Visit: Payer: Medicaid Other | Admitting: Physical Therapy

## 2014-01-22 DIAGNOSIS — G8929 Other chronic pain: Secondary | ICD-10-CM | POA: Insufficient documentation

## 2014-01-22 DIAGNOSIS — F329 Major depressive disorder, single episode, unspecified: Secondary | ICD-10-CM | POA: Insufficient documentation

## 2014-01-22 DIAGNOSIS — K219 Gastro-esophageal reflux disease without esophagitis: Secondary | ICD-10-CM | POA: Insufficient documentation

## 2014-01-22 DIAGNOSIS — J449 Chronic obstructive pulmonary disease, unspecified: Secondary | ICD-10-CM | POA: Insufficient documentation

## 2014-01-22 DIAGNOSIS — M549 Dorsalgia, unspecified: Principal | ICD-10-CM

## 2014-01-22 DIAGNOSIS — M542 Cervicalgia: Secondary | ICD-10-CM

## 2014-01-22 DIAGNOSIS — F32A Depression, unspecified: Secondary | ICD-10-CM | POA: Insufficient documentation

## 2014-01-22 LAB — LIPID PANEL
Cholesterol: 282 mg/dL — AB (ref 0–200)
HDL: 50 mg/dL (ref 35–70)
LDL CALC: 202 mg/dL
TRIGLYCERIDES: 151 mg/dL (ref 40–160)

## 2014-01-22 LAB — HEPATIC FUNCTION PANEL
ALT: 18 U/L (ref 7–35)
AST: 20 U/L (ref 13–35)
Alkaline Phosphatase: 115 U/L (ref 25–125)
BILIRUBIN DIRECT: 0.15 mg/dL (ref 0.01–0.4)
Bilirubin, Total: 0.4 mg/dL

## 2014-01-22 LAB — POCT ERYTHROCYTE SEDIMENTATION RATE, NON-AUTOMATED: Sed Rate: 25 mm

## 2014-01-22 LAB — BASIC METABOLIC PANEL
BUN: 7 mg/dL (ref 4–21)
Creatinine: 1 mg/dL (ref 0.5–1.1)
Glucose: 116 mg/dL
SODIUM: 137 mmol/L (ref 137–147)

## 2014-01-22 LAB — CBC AND DIFFERENTIAL
Neutrophils Absolute: 5 /uL
WBC: 8.3 10^3/mL

## 2014-01-22 LAB — TSH: TSH: 0.7 u[IU]/mL (ref 0.41–5.90)

## 2014-01-22 LAB — HEMOGLOBIN A1C: Hemoglobin A1C: 6.1

## 2014-01-30 ENCOUNTER — Ambulatory Visit: Payer: Self-pay | Admitting: Urology

## 2014-03-06 ENCOUNTER — Emergency Department (HOSPITAL_BASED_OUTPATIENT_CLINIC_OR_DEPARTMENT_OTHER)
Admission: EM | Admit: 2014-03-06 | Discharge: 2014-03-06 | Disposition: A | Discharge status: Home / Self Care | Payer: Medicaid Other | Attending: Emergency Medicine | Admitting: Emergency Medicine

## 2014-03-06 ENCOUNTER — Encounter (HOSPITAL_BASED_OUTPATIENT_CLINIC_OR_DEPARTMENT_OTHER): Payer: Self-pay | Admitting: Emergency Medicine

## 2014-03-06 DIAGNOSIS — L089 Local infection of the skin and subcutaneous tissue, unspecified: Secondary | ICD-10-CM | POA: Diagnosis not present

## 2014-03-06 DIAGNOSIS — Z880 Allergy status to penicillin: Secondary | ICD-10-CM | POA: Insufficient documentation

## 2014-03-06 DIAGNOSIS — Z79899 Other long term (current) drug therapy: Secondary | ICD-10-CM | POA: Insufficient documentation

## 2014-03-06 DIAGNOSIS — M79609 Pain in unspecified limb: Secondary | ICD-10-CM | POA: Diagnosis present

## 2014-03-06 DIAGNOSIS — F319 Bipolar disorder, unspecified: Secondary | ICD-10-CM | POA: Insufficient documentation

## 2014-03-06 DIAGNOSIS — I10 Essential (primary) hypertension: Secondary | ICD-10-CM | POA: Diagnosis not present

## 2014-03-06 DIAGNOSIS — J449 Chronic obstructive pulmonary disease, unspecified: Secondary | ICD-10-CM | POA: Insufficient documentation

## 2014-03-06 DIAGNOSIS — F17200 Nicotine dependence, unspecified, uncomplicated: Secondary | ICD-10-CM | POA: Diagnosis not present

## 2014-03-06 DIAGNOSIS — IMO0001 Reserved for inherently not codable concepts without codable children: Secondary | ICD-10-CM | POA: Insufficient documentation

## 2014-03-06 DIAGNOSIS — E7800 Pure hypercholesterolemia, unspecified: Secondary | ICD-10-CM | POA: Insufficient documentation

## 2014-03-06 DIAGNOSIS — Z8739 Personal history of other diseases of the musculoskeletal system and connective tissue: Secondary | ICD-10-CM | POA: Diagnosis not present

## 2014-03-06 DIAGNOSIS — J4489 Other specified chronic obstructive pulmonary disease: Secondary | ICD-10-CM | POA: Insufficient documentation

## 2014-03-06 HISTORY — DX: Bipolar disorder, unspecified: F31.9

## 2014-03-06 MED ORDER — SULFAMETHOXAZOLE-TRIMETHOPRIM 800-160 MG PO TABS: 1.0000 | ORAL_TABLET | Freq: Two times a day (BID) | ORAL | Status: AC

## 2014-03-06 MED ORDER — CEFTRIAXONE SODIUM 1 G IJ SOLR
1.0000 g | Freq: Once | INTRAMUSCULAR | Status: AC
  Administered 2014-03-06: 1 g via INTRAMUSCULAR
  Filled 2014-03-06: qty 10

## 2014-03-06 MED ORDER — OXYCODONE-ACETAMINOPHEN 5-325 MG PO TABS: 2.0000 | ORAL_TABLET | ORAL | Status: DC | PRN

## 2014-03-06 NOTE — ED Notes (Addendum)
Pt has scattered ulcer areas/open sores to skin "for years" with no tx/dx-area to right LE with redness x 2 weeks

## 2014-03-06 NOTE — ED Provider Notes (Signed)
CSN: [id number]     Arrival date & time 03/06/14  1352 History   First MD Initiated Contact with Patient 03/06/14 1434     Chief Complaint  Patient presents with  . Leg Pain     (Consider location/radiation/quality/duration/timing/severity/associated sxs/prior Treatment) Patient is a 53 y.o. female presenting with leg pain. The history is provided by the patient. No language interpreter was used.  Leg Pain Location:  Leg Leg location:  R leg Pain details:    Quality:  Aching   Radiates to:  Does not radiate   Severity:  Moderate   Timing:  Constant   Progression:  Worsening Chronicity:  New Foreign body present:  No foreign bodies Worsened by:  Nothing tried Ineffective treatments:  None tried Risk factors: no recent illness     Past Medical History  Diagnosis Date  . Fibromyalgia   . COPD (chronic obstructive pulmonary disease)   . Arthritis   . Hypertension   . High cholesterol   . Bipolar disorder    Past Surgical History  Procedure Laterality Date  . Joint replacement    . Carpal tunnel release    . Appendectomy    . Breast lumpectomy    . Tubal ligation    . Knee surgery    . Clavicle surgery     No family history on file. History  Substance Use Topics  . Smoking status: Current Every Day Smoker -- 0.50 packs/day    Types: Cigarettes  . Smokeless tobacco: Not on file  . Alcohol Use: No   OB History   Grav Para Term Preterm Abortions TAB SAB Ect Mult Living                 Review of Systems  Skin: Positive for wound.  All other systems reviewed and are negative.     Allergies  Asa; Doxycycline; Geodon; Hydrocodone; Morphine and related; Penicillins; Prozac; Tetracyclines & related; Tramadol; and Zoloft  Home Medications   Prior to Admission medications   Medication Sig Start Date End Date Taking? Authorizing Provider  albuterol (PROVENTIL HFA;VENTOLIN HFA) 108 (90 BASE) MCG/ACT inhaler Inhale 2 puffs into the lungs every 6 (six) hours as  needed for wheezing.    Historical Provider, MD  cyclobenzaprine (FLEXERIL) 10 MG tablet Take 1 tablet (10 mg total) by mouth 3 (three) times daily as needed for muscle spasms. 01/10/13   John L Molpus, MD  DULoxetine (CYMBALTA) 60 MG capsule Take 60 mg by mouth daily.    Historical Provider, MD  hydrochlorothiazide (HYDRODIURIL) 25 MG tablet Take 25 mg by mouth daily.    Historical Provider, MD  ibuprofen (ADVIL,MOTRIN) 800 MG tablet Take 1 tablet (800 mg total) by mouth 3 (three) times daily. 09/01/12   Fransico Meadow, PA-C  lisinopril (PRINIVIL,ZESTRIL) 10 MG tablet Take 10 mg by mouth daily.    Historical Provider, MD  oxyCODONE-acetaminophen (PERCOCET) 5-325 MG per tablet Take 1-2 tablets by mouth every 4 (four) hours as needed for pain. 01/10/13   Karen Chafe Molpus, MD  oxyCODONE-acetaminophen (PERCOCET/ROXICET) 5-325 MG per tablet Take 1-2 tablets by mouth every 6 (six) hours as needed for pain. 07/19/12   Charles B. Karle Starch, MD  oxyCODONE-acetaminophen (PERCOCET/ROXICET) 5-325 MG per tablet Take 2 tablets by mouth every 4 (four) hours as needed for pain. 09/01/12   Fransico Meadow, PA-C  oxyCODONE-acetaminophen (PERCOCET/ROXICET) 5-325 MG per tablet Take 2 tablets by mouth every 4 (four) hours as needed for pain. 11/07/12   Annie Main  Rancour, MD  rosuvastatin (CRESTOR) 10 MG tablet Take 10 mg by mouth daily.    Historical Provider, MD   BP 164/80  Pulse 98  Temp(Src) 97.9 F (36.6 C) (Oral)  Resp 20  Ht 5\' 1"  (1.549 m)  Wt 193 lb (87.544 kg)  BMI 36.49 kg/m2  SpO2 98% Physical Exam  Nursing note and vitals reviewed. Constitutional: She is oriented to person, place, and time. She appears well-developed and well-nourished.  HENT:  Head: Normocephalic.  Eyes: EOM are normal.  Neck: Normal range of motion.  Pulmonary/Chest: Effort normal.  Abdominal: She exhibits no distension.  Musculoskeletal: Normal range of motion.  Neurological: She is alert and oriented to person, place, and time.  Skin:  There is erythema.  Multiple raised erythematous open sores in various states of healing  Psychiatric: She has a normal mood and affect.    ED Course  Procedures (including critical care time) Labs Review Labs Reviewed - No data to display  Imaging Review No results found.   EKG Interpretation None      MDM   Final diagnoses:  Skin infection     Bactrim Percocet Follow up with yourPhysicain for recheck   Fransico Meadow, PA-C 03/06/14 1510

## 2014-03-07 NOTE — ED Provider Notes (Signed)
Medical screening examination/treatment/procedure(s) were performed by non-physician practitioner and as supervising physician I was immediately available for consultation/collaboration.     Veryl Speak, MD 03/07/14 [phone]

## 2014-03-19 ENCOUNTER — Emergency Department (HOSPITAL_BASED_OUTPATIENT_CLINIC_OR_DEPARTMENT_OTHER)
Admission: EM | Admit: 2014-03-19 | Discharge: 2014-03-19 | Disposition: A | Discharge status: Home / Self Care | Payer: Medicaid Other | Attending: Emergency Medicine | Admitting: Emergency Medicine

## 2014-03-19 ENCOUNTER — Encounter (HOSPITAL_BASED_OUTPATIENT_CLINIC_OR_DEPARTMENT_OTHER): Payer: Self-pay | Admitting: Emergency Medicine

## 2014-03-19 DIAGNOSIS — Z79899 Other long term (current) drug therapy: Secondary | ICD-10-CM | POA: Insufficient documentation

## 2014-03-19 DIAGNOSIS — K0510 Chronic gingivitis, plaque induced: Secondary | ICD-10-CM | POA: Diagnosis not present

## 2014-03-19 DIAGNOSIS — F319 Bipolar disorder, unspecified: Secondary | ICD-10-CM | POA: Insufficient documentation

## 2014-03-19 DIAGNOSIS — K089 Disorder of teeth and supporting structures, unspecified: Secondary | ICD-10-CM | POA: Insufficient documentation

## 2014-03-19 DIAGNOSIS — J449 Chronic obstructive pulmonary disease, unspecified: Secondary | ICD-10-CM | POA: Insufficient documentation

## 2014-03-19 DIAGNOSIS — E7800 Pure hypercholesterolemia, unspecified: Secondary | ICD-10-CM | POA: Diagnosis not present

## 2014-03-19 DIAGNOSIS — Z791 Long term (current) use of non-steroidal anti-inflammatories (NSAID): Secondary | ICD-10-CM | POA: Insufficient documentation

## 2014-03-19 DIAGNOSIS — K029 Dental caries, unspecified: Secondary | ICD-10-CM | POA: Diagnosis not present

## 2014-03-19 DIAGNOSIS — Z880 Allergy status to penicillin: Secondary | ICD-10-CM | POA: Insufficient documentation

## 2014-03-19 DIAGNOSIS — F17200 Nicotine dependence, unspecified, uncomplicated: Secondary | ICD-10-CM | POA: Insufficient documentation

## 2014-03-19 DIAGNOSIS — M129 Arthropathy, unspecified: Secondary | ICD-10-CM | POA: Diagnosis not present

## 2014-03-19 DIAGNOSIS — I10 Essential (primary) hypertension: Secondary | ICD-10-CM | POA: Insufficient documentation

## 2014-03-19 DIAGNOSIS — J4489 Other specified chronic obstructive pulmonary disease: Secondary | ICD-10-CM | POA: Insufficient documentation

## 2014-03-19 MED ORDER — OXYCODONE-ACETAMINOPHEN 5-325 MG PO TABS: 1.0000 | ORAL_TABLET | ORAL | Status: DC | PRN

## 2014-03-19 MED ORDER — CLINDAMYCIN HCL 150 MG PO CAPS: 150.0000 mg | ORAL_CAPSULE | Freq: Four times a day (QID) | ORAL | Status: DC

## 2014-03-19 NOTE — Discharge Instructions (Signed)
Dental Care and Dentist Visits  Dental care supports good overall health. Regular dental visits can also help you avoid dental pain, bleeding, infection, and other more serious health problems in the future. It is important to keep the mouth healthy because diseases in the teeth, gums, and other oral tissues can spread to other areas of the body. Some problems, such as diabetes, heart disease, and pre-term labor have been associated with poor oral health.   See your dentist every 6 months. If you experience emergency problems such as a toothache or broken tooth, go to the dentist right away. If you see your dentist regularly, you may catch problems early. It is easier to be treated for problems in the early stages.   WHAT TO EXPECT AT A DENTIST VISIT   Your dentist will look for many common oral health problems and recommend proper treatment. At your regular dental visit, you can expect:  · Gentle cleaning of the teeth and gums. This includes scraping and polishing. This helps to remove the sticky substance around the teeth and gums (plaque). Plaque forms in the mouth shortly after eating. Over time, plaque hardens on the teeth as tartar. If tartar is not removed regularly, it can cause problems. Cleaning also helps remove stains.  · Periodic X-rays. These pictures of the teeth and supporting bone will help your dentist assess the health of your teeth.  · Periodic fluoride treatments. Fluoride is a natural mineral shown to help strengthen teeth. Fluoride treatment involves applying a fluoride gel or varnish to the teeth. It is most commonly done in children.  · Examination of the mouth, tongue, jaws, teeth, and gums to look for any oral health problems, such as:  ¨ Cavities (dental caries). This is decay on the tooth caused by plaque, sugar, and acid in the mouth. It is best to catch a cavity when it is small.  ¨ Inflammation of the gums caused by plaque buildup (gingivitis).  ¨ Problems with the mouth or malformed  or misaligned teeth.  ¨ Oral cancer or other diseases of the soft tissues or jaws.   KEEP YOUR TEETH AND GUMS HEALTHY  For healthy teeth and gums, follow these general guidelines as well as your dentist's specific advice:  · Have your teeth professionally cleaned at the dentist every 6 months.  · Brush twice daily with a fluoride toothpaste.  · Floss your teeth daily.   · Ask your dentist if you need fluoride supplements, treatments, or fluoride toothpaste.  · Eat a healthy diet. Reduce foods and drinks with added sugar.  · Avoid smoking.  TREATMENT FOR ORAL HEALTH PROBLEMS  If you have oral health problems, treatment varies depending on the conditions present in your teeth and gums.  · Your caregiver will most likely recommend good oral hygiene at each visit.  · For cavities, gingivitis, or other oral health disease, your caregiver will perform a procedure to treat the problem. This is typically done at a separate appointment. Sometimes your caregiver will refer you to another dental specialist for specific tooth problems or for surgery.  SEEK IMMEDIATE DENTAL CARE IF:  · You have pain, bleeding, or soreness in the gum, tooth, jaw, or mouth area.  · A permanent tooth becomes loose or separated from the gum socket.  · You experience a blow or injury to the mouth or jaw area.  Document Released: 02/17/2011 Document Revised: 08/30/2011 Document Reviewed: 02/17/2011  [employer]® Patient Information ©2015 [employer]. This information is not intended to replace advice   given to you by your health care provider. Make sure you discuss any questions you have with your health care provider.

## 2014-03-19 NOTE — ED Notes (Signed)
Pain in her left jaw for a week. States she has an abscessed tooth.

## 2014-03-19 NOTE — ED Provider Notes (Signed)
CSN: [id number]     Arrival date & time 03/19/14  1739 History   First MD Initiated Contact with Patient 03/19/14 1754     Chief Complaint  Patient presents with  . Dental Pain     (Consider location/radiation/quality/duration/timing/severity/associated sxs/prior Treatment) Patient is a 53 y.o. female presenting with tooth pain. The history is provided by the patient.  Dental Pain Location:  Lower Lower teeth location:  20/LL 2nd bicuspid, 19/LL 1st molar, 21/LL 1st bicuspid, 22/LL cuspid, 23/LL lateral incisor and 24/LL central incisor Quality:  Throbbing Severity:  Severe Onset quality:  Gradual Duration:  1 week Timing:  Constant Progression:  Worsening Context: dental caries and poor dentition   Relieved by:  Nothing Worsened by:  Cold food/drink, pressure and touching Ineffective treatments:  Acetaminophen Associated symptoms: gum swelling   Associated symptoms: no trismus   Risk factors: lack of dental care and smoking    MAZE SKLUZACEK is a 53 y.o. female who presents to the ED with dental pain that has been going on for the past week. She has had dental problems in the past. She has chronic COPD and smokes 1/2 pack per day.  Past Medical History  Diagnosis Date  . Fibromyalgia   . COPD (chronic obstructive pulmonary disease)   . Arthritis   . Hypertension   . High cholesterol   . Bipolar disorder    Past Surgical History  Procedure Laterality Date  . Joint replacement    . Carpal tunnel release    . Appendectomy    . Breast lumpectomy    . Tubal ligation    . Knee surgery    . Clavicle surgery     No family history on file. History  Substance Use Topics  . Smoking status: Current Every Day Smoker -- 0.50 packs/day    Types: Cigarettes  . Smokeless tobacco: Not on file  . Alcohol Use: No   OB History   Grav Para Term Preterm Abortions TAB SAB Ect Mult Living                 Review of Systems Negative except as stated in HPI   Allergies  Asa;  Doxycycline; Geodon; Hydrocodone; Morphine and related; Penicillins; Prozac; Tetracyclines & related; Tramadol; and Zoloft  Home Medications   Prior to Admission medications   Medication Sig Start Date End Date Taking? Authorizing Provider  albuterol (PROVENTIL HFA;VENTOLIN HFA) 108 (90 BASE) MCG/ACT inhaler Inhale 2 puffs into the lungs every 6 (six) hours as needed for wheezing.    Historical Provider, MD  cyclobenzaprine (FLEXERIL) 10 MG tablet Take 1 tablet (10 mg total) by mouth 3 (three) times daily as needed for muscle spasms. 01/10/13   John L Molpus, MD  DULoxetine (CYMBALTA) 60 MG capsule Take 60 mg by mouth daily.    Historical Provider, MD  hydrochlorothiazide (HYDRODIURIL) 25 MG tablet Take 25 mg by mouth daily.    Historical Provider, MD  ibuprofen (ADVIL,MOTRIN) 800 MG tablet Take 1 tablet (800 mg total) by mouth 3 (three) times daily. 09/01/12   Fransico Meadow, PA-C  lisinopril (PRINIVIL,ZESTRIL) 10 MG tablet Take 10 mg by mouth daily.    Historical Provider, MD  oxyCODONE-acetaminophen (PERCOCET) 5-325 MG per tablet Take 1-2 tablets by mouth every 4 (four) hours as needed for pain. 01/10/13   Karen Chafe Molpus, MD  oxyCODONE-acetaminophen (PERCOCET/ROXICET) 5-325 MG per tablet Take 1-2 tablets by mouth every 6 (six) hours as needed for pain. 07/19/12   Juanda Crumble  Neldon Newport, MD  oxyCODONE-acetaminophen (PERCOCET/ROXICET) 5-325 MG per tablet Take 2 tablets by mouth every 4 (four) hours as needed for pain. 09/01/12   Fransico Meadow, PA-C  oxyCODONE-acetaminophen (PERCOCET/ROXICET) 5-325 MG per tablet Take 2 tablets by mouth every 4 (four) hours as needed for pain. 11/07/12   Ezequiel Essex, MD  oxyCODONE-acetaminophen (PERCOCET/ROXICET) 5-325 MG per tablet Take 2 tablets by mouth every 4 (four) hours as needed for severe pain. 03/06/14   Fransico Meadow, PA-C  rosuvastatin (CRESTOR) 10 MG tablet Take 10 mg by mouth daily.    Historical Provider, MD   BP 177/95  Pulse 106  Temp(Src) 97.9 F  (36.6 C) (Oral)  Resp 20  Ht 5' (1.524 m)  Wt 193 lb (87.544 kg)  BMI 37.69 kg/m2  SpO2 98% Physical Exam  Nursing note and vitals reviewed. Constitutional: She is oriented to person, place, and time. She appears well-developed and well-nourished. No distress.  HENT:  Head: Normocephalic.  Mouth/Throat: Uvula is midline, oropharynx is clear and moist and mucous membranes are normal.    Multiple dental caries and broken teeth. Gum swelling and erythema lower.   Eyes: EOM are normal.  Neck: Neck supple.  Cardiovascular: Normal rate.   Pulmonary/Chest: Effort normal.  Abdominal: Soft. There is no tenderness.  Musculoskeletal: Normal range of motion.  Lymphadenopathy:    She has no cervical adenopathy.  Neurological: She is alert and oriented to person, place, and time. No cranial nerve deficit.  Skin: Skin is warm and dry.  Psychiatric: She has a normal mood and affect. Her behavior is normal.    ED Course  Procedures (  MDM  53 y.o. female with dental pain due to multiple caries, gingivitis and poor dental care. Will treat with antibiotics and for pain. She is to follow up with a dentist as soon as possible. Discussed with the patient and all questioned fully answered.   Medication List    TAKE these medications       clindamycin 150 MG capsule  Commonly known as:  CLEOCIN  Take 1 capsule (150 mg total) by mouth every 6 (six) hours.      ASK your doctor about these medications       albuterol 108 (90 BASE) MCG/ACT inhaler  Commonly known as:  PROVENTIL HFA;VENTOLIN HFA  Inhale 2 puffs into the lungs every 6 (six) hours as needed for wheezing.     cyclobenzaprine 10 MG tablet  Commonly known as:  FLEXERIL  Take 1 tablet (10 mg total) by mouth 3 (three) times daily as needed for muscle spasms.     DULoxetine 60 MG capsule  Commonly known as:  CYMBALTA  Take 60 mg by mouth daily.     hydrochlorothiazide 25 MG tablet  Commonly known as:  HYDRODIURIL  Take 25 mg  by mouth daily.     ibuprofen 800 MG tablet  Commonly known as:  ADVIL,MOTRIN  Take 1 tablet (800 mg total) by mouth 3 (three) times daily.     lisinopril 10 MG tablet  Commonly known as:  PRINIVIL,ZESTRIL  Take 10 mg by mouth daily.     oxyCODONE-acetaminophen 5-325 MG per tablet  Commonly known as:  PERCOCET/ROXICET  Take 2 tablets by mouth every 4 (four) hours as needed for severe pain.  Ask about: Which instructions should I use?     oxyCODONE-acetaminophen 5-325 MG per tablet  Commonly known as:  ROXICET  Take 1 tablet by mouth every 4 (four) hours as  needed for severe pain.  Ask about: Which instructions should I use?     rosuvastatin 10 MG tablet  Commonly known as:  CRESTOR  Take 10 mg by mouth daily.            [place], [place] 03/19/14 [phone]

## 2014-03-19 NOTE — ED Notes (Signed)
np at bedside

## 2014-03-20 NOTE — ED Provider Notes (Signed)
Medical screening examination/treatment/procedure(s) were performed by non-physician practitioner and as supervising physician I was immediately available for consultation/collaboration.  Ephraim Hamburger, MD 03/20/14 0001

## 2014-04-04 ENCOUNTER — Encounter (HOSPITAL_BASED_OUTPATIENT_CLINIC_OR_DEPARTMENT_OTHER): Payer: Self-pay | Admitting: Emergency Medicine

## 2014-04-04 ENCOUNTER — Emergency Department (HOSPITAL_BASED_OUTPATIENT_CLINIC_OR_DEPARTMENT_OTHER): Payer: Medicaid Other

## 2014-04-04 ENCOUNTER — Emergency Department (HOSPITAL_BASED_OUTPATIENT_CLINIC_OR_DEPARTMENT_OTHER)
Admission: EM | Admit: 2014-04-04 | Discharge: 2014-04-04 | Disposition: A | Discharge status: Home / Self Care | Payer: Medicaid Other | Attending: Emergency Medicine | Admitting: Emergency Medicine

## 2014-04-04 DIAGNOSIS — I10 Essential (primary) hypertension: Secondary | ICD-10-CM | POA: Diagnosis not present

## 2014-04-04 DIAGNOSIS — Z720 Tobacco use: Secondary | ICD-10-CM | POA: Diagnosis not present

## 2014-04-04 DIAGNOSIS — F319 Bipolar disorder, unspecified: Secondary | ICD-10-CM | POA: Insufficient documentation

## 2014-04-04 DIAGNOSIS — J449 Chronic obstructive pulmonary disease, unspecified: Secondary | ICD-10-CM | POA: Diagnosis not present

## 2014-04-04 DIAGNOSIS — Z79899 Other long term (current) drug therapy: Secondary | ICD-10-CM | POA: Diagnosis not present

## 2014-04-04 DIAGNOSIS — Z880 Allergy status to penicillin: Secondary | ICD-10-CM | POA: Diagnosis not present

## 2014-04-04 DIAGNOSIS — E780 Pure hypercholesterolemia: Secondary | ICD-10-CM | POA: Insufficient documentation

## 2014-04-04 DIAGNOSIS — M25562 Pain in left knee: Secondary | ICD-10-CM | POA: Insufficient documentation

## 2014-04-04 DIAGNOSIS — M797 Fibromyalgia: Secondary | ICD-10-CM | POA: Insufficient documentation

## 2014-04-04 DIAGNOSIS — Z792 Long term (current) use of antibiotics: Secondary | ICD-10-CM | POA: Insufficient documentation

## 2014-04-04 MED ORDER — OXYCODONE-ACETAMINOPHEN 5-325 MG PO TABS: 1.0000 | ORAL_TABLET | Freq: Four times a day (QID) | ORAL | Status: DC | PRN

## 2014-04-04 NOTE — ED Notes (Signed)
Pt smiling in nad, reports knee pain "for years", worse over last 2 days.

## 2014-04-04 NOTE — ED Provider Notes (Signed)
CSN: [id number]     Arrival date & time 04/04/14  0808 History   First MD Initiated Contact with Patient 04/04/14 [phone]     Chief Complaint  Patient presents with  . Knee Pain     (Consider location/radiation/quality/duration/timing/severity/associated sxs/prior Treatment) Patient is a 53 y.o. female presenting with knee pain. The history is provided by the patient.  Knee Pain Location:  Knee Time since incident:  2 days Injury: yes   Mechanism of injury comment:  Twisted while walking in a grocery store Knee location:  L knee Pain details:    Radiates to:  L leg   Severity:  Moderate   Onset quality:  Sudden   Duration:  2 days   Timing:  Constant   Progression:  Unchanged Chronicity:  New Dislocation: no   Foreign body present:  No foreign bodies Relieved by:  Nothing Worsened by:  Bearing weight, flexion, rotation and activity Associated symptoms: no fever     Past Medical History  Diagnosis Date  . Fibromyalgia   . COPD (chronic obstructive pulmonary disease)   . Arthritis   . Hypertension   . High cholesterol   . Bipolar disorder    Past Surgical History  Procedure Laterality Date  . Joint replacement    . Carpal tunnel release    . Appendectomy    . Breast lumpectomy    . Tubal ligation    . Knee surgery    . Clavicle surgery     History reviewed. No pertinent family history. History  Substance Use Topics  . Smoking status: Current Every Day Smoker -- 0.50 packs/day    Types: Cigarettes  . Smokeless tobacco: Not on file  . Alcohol Use: No   OB History   Grav Para Term Preterm Abortions TAB SAB Ect Mult Living                 Review of Systems  Constitutional: Negative for fever.  Respiratory: Negative for cough and shortness of breath.   All other systems reviewed and are negative.     Allergies  Asa; Doxycycline; Geodon; Hydrocodone; Morphine and related; Penicillins; Prozac; Tetracyclines & related; Tramadol; and Zoloft  Home  Medications   Prior to Admission medications   Medication Sig Start Date End Date Taking? Authorizing Provider  albuterol (PROVENTIL HFA;VENTOLIN HFA) 108 (90 BASE) MCG/ACT inhaler Inhale 2 puffs into the lungs every 6 (six) hours as needed for wheezing.    Historical Provider, MD  clindamycin (CLEOCIN) 150 MG capsule Take 1 capsule (150 mg total) by mouth every 6 (six) hours. 03/19/14   Hope Bunnie Pion, NP  cyclobenzaprine (FLEXERIL) 10 MG tablet Take 1 tablet (10 mg total) by mouth 3 (three) times daily as needed for muscle spasms. 01/10/13   John L Molpus, MD  DULoxetine (CYMBALTA) 60 MG capsule Take 60 mg by mouth daily.    Historical Provider, MD  hydrochlorothiazide (HYDRODIURIL) 25 MG tablet Take 25 mg by mouth daily.    Historical Provider, MD  ibuprofen (ADVIL,MOTRIN) 800 MG tablet Take 1 tablet (800 mg total) by mouth 3 (three) times daily. 09/01/12   Fransico Meadow, PA-C  lisinopril (PRINIVIL,ZESTRIL) 10 MG tablet Take 10 mg by mouth daily.    Historical Provider, MD  oxyCODONE-acetaminophen (PERCOCET/ROXICET) 5-325 MG per tablet Take 2 tablets by mouth every 4 (four) hours as needed for severe pain. 03/06/14   Fransico Meadow, PA-C  oxyCODONE-acetaminophen (ROXICET) 5-325 MG per tablet Take 1 tablet by mouth  every 4 (four) hours as needed for severe pain. 03/19/14   Hope Bunnie Pion, NP  rosuvastatin (CRESTOR) 10 MG tablet Take 10 mg by mouth daily.    Historical Provider, MD   BP 164/108  Pulse 94  Temp(Src) 98.7 F (37.1 C) (Oral)  Resp 16  Ht 5\' 1"  (1.549 m)  Wt 192 lb (87.091 kg)  BMI 36.30 kg/m2  SpO2 99% Physical Exam  Nursing note and vitals reviewed. Constitutional: She is oriented to person, place, and time. She appears well-developed and well-nourished. No distress.  HENT:  Head: Normocephalic and atraumatic.  Mouth/Throat: Oropharynx is clear and moist.  Eyes: EOM are normal. Pupils are equal, round, and reactive to light.  Neck: Normal range of motion. Neck supple.   Cardiovascular: Normal rate and regular rhythm.  Exam reveals no friction rub.   No murmur heard. Pulmonary/Chest: Effort normal and breath sounds normal. No respiratory distress. She has no wheezes. She has no rales.  Abdominal: Soft. She exhibits no distension. There is no tenderness. There is no rebound.  Musculoskeletal: She exhibits no edema.       Left knee: She exhibits decreased range of motion and bony tenderness (superior tibia). She exhibits no swelling, no effusion, no ecchymosis, no deformity, no laceration, normal alignment, no LCL laxity, normal patellar mobility and no MCL laxity. Tenderness found. Medial joint line, lateral joint line, MCL, LCL and patellar tendon tenderness noted.  Neurological: She is alert and oriented to person, place, and time. No cranial nerve deficit. She exhibits normal muscle tone. Coordination normal.  Skin: Skin is warm. No rash noted. She is not diaphoretic.    ED Course  Procedures (including critical care time) Labs Review Labs Reviewed - No data to display  Imaging Review Dg Knee Complete 4 Views Left  04/04/2014   CLINICAL DATA:  Pain and swelling secondary to a twisting injury 2 days ago.  EXAM: LEFT KNEE - COMPLETE 4+ VIEW  COMPARISON:  None.  FINDINGS: There is no fracture or dislocation. There is a small joint effusion. Tiny marginal osteophytes in all 3 compartments.  IMPRESSION: Small joint effusion.   Electronically Signed   By: Rozetta Nunnery M.D.   On: 04/04/2014 09:04     EKG Interpretation None      MDM   Final diagnoses:  Knee pain, acute, left    61F here with L knee pain. Twisted while walking 2 days ago, now having difficulty with ambulation. No actual trauma. AFVSS here. L knee without effusion. Knee tender, no deformities. No laxity in L knee, but does have pain with varus/valgus and ant/post stressing.. Likely ligamentous injury, will give knee immobilizer and crutches. Will send home with Rx for pain meds.   Xray  ok. Stable for discharge.  Given crutches, knee immobilizer, pain meds.  Evelina Bucy, MD 04/04/14 [phone]

## 2014-04-04 NOTE — Discharge Instructions (Signed)

## 2014-05-03 ENCOUNTER — Encounter (HOSPITAL_BASED_OUTPATIENT_CLINIC_OR_DEPARTMENT_OTHER): Payer: Self-pay

## 2014-05-03 ENCOUNTER — Emergency Department (HOSPITAL_BASED_OUTPATIENT_CLINIC_OR_DEPARTMENT_OTHER)
Admission: EM | Admit: 2014-05-03 | Discharge: 2014-05-03 | Disposition: A | Discharge status: Home / Self Care | Payer: Medicaid Other | Attending: Emergency Medicine | Admitting: Emergency Medicine

## 2014-05-03 DIAGNOSIS — I10 Essential (primary) hypertension: Secondary | ICD-10-CM | POA: Diagnosis not present

## 2014-05-03 DIAGNOSIS — L989 Disorder of the skin and subcutaneous tissue, unspecified: Secondary | ICD-10-CM | POA: Diagnosis not present

## 2014-05-03 DIAGNOSIS — K0381 Cracked tooth: Secondary | ICD-10-CM | POA: Insufficient documentation

## 2014-05-03 DIAGNOSIS — K002 Abnormalities of size and form of teeth: Secondary | ICD-10-CM | POA: Diagnosis not present

## 2014-05-03 DIAGNOSIS — E780 Pure hypercholesterolemia: Secondary | ICD-10-CM | POA: Insufficient documentation

## 2014-05-03 DIAGNOSIS — Z880 Allergy status to penicillin: Secondary | ICD-10-CM | POA: Insufficient documentation

## 2014-05-03 DIAGNOSIS — M1990 Unspecified osteoarthritis, unspecified site: Secondary | ICD-10-CM | POA: Insufficient documentation

## 2014-05-03 DIAGNOSIS — Z720 Tobacco use: Secondary | ICD-10-CM | POA: Insufficient documentation

## 2014-05-03 DIAGNOSIS — K0889 Other specified disorders of teeth and supporting structures: Secondary | ICD-10-CM

## 2014-05-03 DIAGNOSIS — F319 Bipolar disorder, unspecified: Secondary | ICD-10-CM | POA: Diagnosis not present

## 2014-05-03 DIAGNOSIS — K088 Other specified disorders of teeth and supporting structures: Secondary | ICD-10-CM | POA: Diagnosis present

## 2014-05-03 DIAGNOSIS — M797 Fibromyalgia: Secondary | ICD-10-CM | POA: Diagnosis not present

## 2014-05-03 DIAGNOSIS — Z79899 Other long term (current) drug therapy: Secondary | ICD-10-CM | POA: Insufficient documentation

## 2014-05-03 DIAGNOSIS — J449 Chronic obstructive pulmonary disease, unspecified: Secondary | ICD-10-CM | POA: Insufficient documentation

## 2014-05-03 MED ORDER — CLINDAMYCIN HCL 150 MG PO CAPS
300.0000 mg | ORAL_CAPSULE | Freq: Once | ORAL | Status: AC
  Administered 2014-05-03: 300 mg via ORAL
  Filled 2014-05-03: qty 2

## 2014-05-03 MED ORDER — OXYCODONE-ACETAMINOPHEN 5-325 MG PO TABS: 2.0000 | ORAL_TABLET | ORAL | Status: DC | PRN

## 2014-05-03 MED ORDER — CLINDAMYCIN HCL 300 MG PO CAPS: 300.0000 mg | ORAL_CAPSULE | Freq: Four times a day (QID) | ORAL | Status: AC

## 2014-05-03 NOTE — ED Notes (Signed)
Dental pain x 3 days unrelieved after taking Ibuprofen.  Lesion that are painful on right and abdomen arm x 2 weeks.

## 2014-05-03 NOTE — Discharge Instructions (Signed)
Dental Pain Follow up with a dentist as soon as possible. Return to the ED if you develop new or worsening symptoms. A tooth ache may be caused by cavities (tooth decay). Cavities expose the nerve of the tooth to air and hot or cold temperatures. It may come from an infection or abscess (also called a boil or furuncle) around your tooth. It is also often caused by dental caries (tooth decay). This causes the pain you are having. DIAGNOSIS  Your caregiver can diagnose this problem by exam. TREATMENT   If caused by an infection, it may be treated with medications which kill germs (antibiotics) and pain medications as prescribed by your caregiver. Take medications as directed.  Only take over-the-counter or prescription medicines for pain, discomfort, or fever as directed by your caregiver.  Whether the tooth ache today is caused by infection or dental disease, you should see your dentist as soon as possible for further care. SEEK MEDICAL CARE IF: The exam and treatment you received today has been provided on an emergency basis only. This is not a substitute for complete medical or dental care. If your problem worsens or new problems (symptoms) appear, and you are unable to meet with your dentist, call or return to this location. SEEK IMMEDIATE MEDICAL CARE IF:   You have a fever.  You develop redness and swelling of your face, jaw, or neck.  You are unable to open your mouth.  You have severe pain uncontrolled by pain medicine. MAKE SURE YOU:   Understand these instructions.  Will watch your condition.  Will get help right away if you are not doing well or get worse. Document Released: 06/07/2005 Document Revised: 08/30/2011 Document Reviewed: 01/24/2008 [facility] Patient Information 2015 [place], [place]. This information is not intended to replace advice given to you by your health care provider. Make sure you discuss any questions you have with your health care provider.

## 2014-05-03 NOTE — ED Provider Notes (Signed)
CSN: [id number]     Arrival date & time 05/03/14  1002 History  This chart was scribed for Leslie Essex, MD by Ludger Nutting, ED Scribe. This patient was seen in room MH05/MH05 and the patient's care was started 10:36 AM.    Chief Complaint  Patient presents with  . Dental Pain   The history is provided by the patient. No language interpreter was used.    HPI Comments: Leslie King is a 53 y.o. female who presents to the Emergency Department complaining of 2-3 days of constant, gradually worsened right lower dental pain. Patient states she has an old fractured tooth that is causing her pain. She has not followed up with a dentist. She has taken tylenol without significant relief. She denies fever.  She also complains of 2 weeks of gradual onset, constant, gradually worsened skin lesions to the right arm and right abdomen. She reports having similar symptoms in the past for which she was prescribed clindamycin.    Past Medical History  Diagnosis Date  . Fibromyalgia   . COPD (chronic obstructive pulmonary disease)   . Arthritis   . Hypertension   . High cholesterol   . Bipolar disorder    Past Surgical History  Procedure Laterality Date  . Carpal tunnel release    . Appendectomy    . Breast lumpectomy    . Tubal ligation    . Knee surgery    . Clavicle surgery     No family history on file. History  Substance Use Topics  . Smoking status: Current Every Day Smoker -- 0.50 packs/day    Types: Cigarettes  . Smokeless tobacco: Not on file  . Alcohol Use: No   OB History    No data available     Review of Systems  A complete 10 system review of systems was obtained and all systems are negative except as noted in the HPI and PMH.    Allergies  Asa; Doxycycline; Geodon; Hydrocodone; Morphine and related; Penicillins; Prozac; Tetracyclines & related; Tramadol; and Zoloft  Home Medications   Prior to Admission medications   Medication Sig Start Date End Date Taking?  Authorizing Provider  albuterol (PROVENTIL HFA;VENTOLIN HFA) 108 (90 BASE) MCG/ACT inhaler Inhale 2 puffs into the lungs every 6 (six) hours as needed for wheezing.    Historical Provider, MD  clindamycin (CLEOCIN) 300 MG capsule Take 1 capsule (300 mg total) by mouth every 6 (six) hours. 05/03/14   Leslie Essex, MD  cyclobenzaprine (FLEXERIL) 10 MG tablet Take 1 tablet (10 mg total) by mouth 3 (three) times daily as needed for muscle spasms. 01/10/13   John L Molpus, MD  DULoxetine (CYMBALTA) 60 MG capsule Take 60 mg by mouth daily.    Historical Provider, MD  hydrochlorothiazide (HYDRODIURIL) 25 MG tablet Take 25 mg by mouth daily.    Historical Provider, MD  ibuprofen (ADVIL,MOTRIN) 800 MG tablet Take 1 tablet (800 mg total) by mouth 3 (three) times daily. 09/01/12   Fransico Meadow, PA-C  lisinopril (PRINIVIL,ZESTRIL) 10 MG tablet Take 10 mg by mouth daily.    Historical Provider, MD  oxyCODONE-acetaminophen (PERCOCET/ROXICET) 5-325 MG per tablet Take 2 tablets by mouth every 4 (four) hours as needed for severe pain. 05/03/14   Leslie Essex, MD  rosuvastatin (CRESTOR) 10 MG tablet Take 10 mg by mouth daily.    Historical Provider, MD   BP 173/90 mmHg  Pulse 85  Temp(Src) 97.9 F (36.6 C) (Oral)  Resp 12  Ht 5\' 1"  (1.549 m)  Wt 191 lb (86.637 kg)  BMI 36.11 kg/m2  SpO2 100% Physical Exam  Constitutional: She is oriented to person, place, and time. She appears well-developed and well-nourished. No distress.  HENT:  Head: Normocephalic and atraumatic.  Mouth/Throat: Oropharynx is clear and moist. Abnormal dentition. No dental abscesses. No oropharyngeal exudate.  Poor dentition throughout. Right lower incisor broken off at gumline and tender. Floor of mouth soft. No abscess.   Eyes: Conjunctivae and EOM are normal. Pupils are equal, round, and reactive to light.  Neck: Normal range of motion. Neck supple.  No meningismus.  Cardiovascular: Normal rate, regular rhythm, normal heart  sounds and intact distal pulses.   No murmur heard. Pulmonary/Chest: Effort normal and breath sounds normal. No respiratory distress.  Abdominal: Soft. There is no tenderness. There is no rebound and no guarding.  Musculoskeletal: Normal range of motion. She exhibits no edema or tenderness.  Neurological: She is alert and oriented to person, place, and time. No cranial nerve deficit. She exhibits normal muscle tone. Coordination normal.  No ataxia on finger to nose bilaterally. No pronator drift. 5/5 strength throughout. CN 2-12 intact. Negative Romberg. Equal grip strength. Sensation intact. Gait is normal.   Skin: Skin is warm.  Excoriated crusted lesions to R forearm and abdomen.  Mild cellulitis of lesion to R upper abdomen  Psychiatric: She has a normal mood and affect. Her behavior is normal.  Nursing note and vitals reviewed.   ED Course  Procedures (including critical care time)  DIAGNOSTIC STUDIES: Oxygen Saturation is 100% on RA, normal by my interpretation.    COORDINATION OF CARE: 10:41 AM Will prescribe pain medication and antibiotics. Discussed treatment plan with pt at bedside and pt agreed to plan.   Labs Review Labs Reviewed - No data to display  Imaging Review No results found.   EKG Interpretation None      MDM   Final diagnoses:  Pain, dental  2 days of R lower dental pain.  No fever, vomiting.  No drooling. noSOB or pain with swallowing    Poor dentition, multiple missing teeth. R lower incisor broken off at gum line. Floor mouth of soft. No abscess. No evidence of ludwig's angina.  Patient with healing excoriated lesions to R arm and abdomen.  Mild cellulitis of one lesion on R abdomen. Clindamycin, pain control, PCP followup.  BP 173/90 mmHg  Pulse 85  Temp(Src) 97.9 F (36.6 C) (Oral)  Resp 12  Ht 5\' 1"  (1.549 m)  Wt 191 lb (86.637 kg)  BMI 36.11 kg/m2  SpO2 100%    Leslie Essex, MD 05/03/14 1612

## 2014-05-13 ENCOUNTER — Emergency Department (HOSPITAL_BASED_OUTPATIENT_CLINIC_OR_DEPARTMENT_OTHER)
Admission: EM | Admit: 2014-05-13 | Discharge: 2014-05-13 | Disposition: A | Discharge status: Home / Self Care | Payer: Medicaid Other | Attending: Emergency Medicine | Admitting: Emergency Medicine

## 2014-05-13 ENCOUNTER — Encounter (HOSPITAL_BASED_OUTPATIENT_CLINIC_OR_DEPARTMENT_OTHER): Payer: Self-pay | Admitting: *Deleted

## 2014-05-13 DIAGNOSIS — I10 Essential (primary) hypertension: Secondary | ICD-10-CM

## 2014-05-13 DIAGNOSIS — K088 Other specified disorders of teeth and supporting structures: Secondary | ICD-10-CM | POA: Diagnosis not present

## 2014-05-13 DIAGNOSIS — F319 Bipolar disorder, unspecified: Secondary | ICD-10-CM | POA: Diagnosis not present

## 2014-05-13 DIAGNOSIS — E780 Pure hypercholesterolemia: Secondary | ICD-10-CM | POA: Diagnosis not present

## 2014-05-13 DIAGNOSIS — M797 Fibromyalgia: Secondary | ICD-10-CM | POA: Insufficient documentation

## 2014-05-13 DIAGNOSIS — Z791 Long term (current) use of non-steroidal anti-inflammatories (NSAID): Secondary | ICD-10-CM | POA: Diagnosis not present

## 2014-05-13 DIAGNOSIS — J449 Chronic obstructive pulmonary disease, unspecified: Secondary | ICD-10-CM | POA: Insufficient documentation

## 2014-05-13 DIAGNOSIS — Z87828 Personal history of other (healed) physical injury and trauma: Secondary | ICD-10-CM | POA: Diagnosis not present

## 2014-05-13 DIAGNOSIS — Z792 Long term (current) use of antibiotics: Secondary | ICD-10-CM | POA: Diagnosis not present

## 2014-05-13 DIAGNOSIS — M1990 Unspecified osteoarthritis, unspecified site: Secondary | ICD-10-CM | POA: Insufficient documentation

## 2014-05-13 DIAGNOSIS — Z720 Tobacco use: Secondary | ICD-10-CM | POA: Diagnosis not present

## 2014-05-13 DIAGNOSIS — Z880 Allergy status to penicillin: Secondary | ICD-10-CM | POA: Insufficient documentation

## 2014-05-13 DIAGNOSIS — Z79899 Other long term (current) drug therapy: Secondary | ICD-10-CM | POA: Insufficient documentation

## 2014-05-13 MED ORDER — HYDROCHLOROTHIAZIDE 25 MG PO TABS
25.0000 mg | ORAL_TABLET | Freq: Every day | ORAL | Status: DC
  Administered 2014-05-13: 25 mg via ORAL
  Filled 2014-05-13 (×2): qty 1

## 2014-05-13 MED ORDER — LISINOPRIL 10 MG PO TABS
10.0000 mg | ORAL_TABLET | Freq: Once | ORAL | Status: AC
  Administered 2014-05-13: 10 mg via ORAL

## 2014-05-13 MED ORDER — LISINOPRIL 10 MG PO TABS: 10.0000 mg | ORAL_TABLET | Freq: Every day | ORAL | Status: AC

## 2014-05-13 MED ORDER — HYDROCHLOROTHIAZIDE 25 MG PO TABS: 25.0000 mg | ORAL_TABLET | Freq: Every day | ORAL | Status: AC

## 2014-05-13 MED ORDER — OXYCODONE-ACETAMINOPHEN 5-325 MG PO TABS: 2.0000 | ORAL_TABLET | ORAL | Status: AC | PRN

## 2014-05-13 NOTE — ED Provider Notes (Signed)
CSN: [id number]     Arrival date & time 05/13/14  [id number] History   First MD Initiated Contact with Patient 05/13/14 [phone]     Chief Complaint  Patient presents with  . Hypertension     (Consider location/radiation/quality/duration/timing/severity/associated sxs/prior Treatment) Patient is a 53 y.o. female presenting with hypertension. The history is provided by the patient. No language interpreter was used.  Hypertension This is a new problem. The current episode started more than 1 month ago. The problem occurs constantly. The problem has been gradually worsening. Associated symptoms include chest pain and joint swelling. Nothing aggravates the symptoms. She has tried nothing for the symptoms. The treatment provided moderate relief.  Pt reports she is out of her blood pressure medication.   Pt also asking for pain medication for chronic mouth and dental pain  Pt request percocet.   Past Medical History  Diagnosis Date  . Fibromyalgia   . COPD (chronic obstructive pulmonary disease)   . Arthritis   . Hypertension   . High cholesterol   . Bipolar disorder    Past Surgical History  Procedure Laterality Date  . Carpal tunnel release    . Appendectomy    . Breast lumpectomy    . Tubal ligation    . Knee surgery    . Clavicle surgery     No family history on file. History  Substance Use Topics  . Smoking status: Current Every Day Smoker -- 1.00 packs/day    Types: Cigarettes  . Smokeless tobacco: Not on file  . Alcohol Use: No   OB History    No data available     Review of Systems  HENT: Positive for dental problem.   Cardiovascular: Positive for chest pain.  Musculoskeletal: Positive for joint swelling.  All other systems reviewed and are negative.     Allergies  Asa; Doxycycline; Geodon; Hydrocodone; Morphine and related; Penicillins; Prozac; Tetracyclines & related; Tramadol; and Zoloft  Home Medications   Prior to Admission medications   Medication Sig Start  Date End Date Taking? Authorizing Provider  albuterol (PROVENTIL HFA;VENTOLIN HFA) 108 (90 BASE) MCG/ACT inhaler Inhale 2 puffs into the lungs every 6 (six) hours as needed for wheezing.    Historical Provider, MD  clindamycin (CLEOCIN) 300 MG capsule Take 1 capsule (300 mg total) by mouth every 6 (six) hours. 05/03/14   Ezequiel Essex, MD  cyclobenzaprine (FLEXERIL) 10 MG tablet Take 1 tablet (10 mg total) by mouth 3 (three) times daily as needed for muscle spasms. 01/10/13   John L Molpus, MD  DULoxetine (CYMBALTA) 60 MG capsule Take 60 mg by mouth daily.    Historical Provider, MD  hydrochlorothiazide (HYDRODIURIL) 25 MG tablet Take 25 mg by mouth daily.    Historical Provider, MD  ibuprofen (ADVIL,MOTRIN) 800 MG tablet Take 1 tablet (800 mg total) by mouth 3 (three) times daily. 09/01/12   Fransico Meadow, PA-C  lisinopril (PRINIVIL,ZESTRIL) 10 MG tablet Take 10 mg by mouth daily.    Historical Provider, MD  oxyCODONE-acetaminophen (PERCOCET/ROXICET) 5-325 MG per tablet Take 2 tablets by mouth every 4 (four) hours as needed for severe pain. 05/03/14   Ezequiel Essex, MD  rosuvastatin (CRESTOR) 10 MG tablet Take 10 mg by mouth daily.    Historical Provider, MD   BP 212/112 mmHg  Pulse 95  Temp(Src) 98.3 F (36.8 C) (Oral)  Resp 16  Ht 5' (1.524 m)  Wt 192 lb (87.091 kg)  BMI 37.50 kg/m2  SpO2 99% Physical Exam  Constitutional: She appears well-developed and well-nourished.  HENT:  Dental decay  Neck: Normal range of motion.  Cardiovascular: Normal rate.   Pulmonary/Chest: Effort normal.  Musculoskeletal: Normal range of motion.  Neurological: She is alert.  Skin: Skin is warm.  Psychiatric: She has a normal mood and affect.  Nursing note and vitals reviewed.   ED Course  Procedures (including critical care time) Labs Review Labs Reviewed - No data to display  Imaging Review No results found.   EKG Interpretation None      MDM   Final diagnoses:  Essential  hypertension    Pt given lisinopril 10 and hctz 25 here.   Rx for lisiniopril hctz Percocet  10 tablets.      Hollace Kinnier [place], PA-C 05/13/14 [place], MD 05/13/14 1039

## 2014-05-13 NOTE — Discharge Instructions (Signed)

## 2014-05-13 NOTE — ED Notes (Signed)
C/o high blood pressure with h/a all over head. States she was going to have teeth pulled this month and dentist will not pull teeth with BP so high.

## 2014-08-24 ENCOUNTER — Encounter (HOSPITAL_BASED_OUTPATIENT_CLINIC_OR_DEPARTMENT_OTHER): Payer: Self-pay

## 2014-08-24 ENCOUNTER — Emergency Department (HOSPITAL_BASED_OUTPATIENT_CLINIC_OR_DEPARTMENT_OTHER)
Admission: EM | Admit: 2014-08-24 | Discharge: 2014-08-24 | Disposition: A | Discharge status: Home / Self Care | Payer: Medicaid Other | Attending: Emergency Medicine | Admitting: Emergency Medicine

## 2014-08-24 ENCOUNTER — Emergency Department (HOSPITAL_BASED_OUTPATIENT_CLINIC_OR_DEPARTMENT_OTHER): Payer: Medicaid Other

## 2014-08-24 DIAGNOSIS — M791 Myalgia, unspecified site: Secondary | ICD-10-CM

## 2014-08-24 DIAGNOSIS — Z880 Allergy status to penicillin: Secondary | ICD-10-CM | POA: Insufficient documentation

## 2014-08-24 DIAGNOSIS — J029 Acute pharyngitis, unspecified: Secondary | ICD-10-CM | POA: Diagnosis not present

## 2014-08-24 DIAGNOSIS — Z720 Tobacco use: Secondary | ICD-10-CM | POA: Diagnosis not present

## 2014-08-24 DIAGNOSIS — Z79899 Other long term (current) drug therapy: Secondary | ICD-10-CM | POA: Insufficient documentation

## 2014-08-24 DIAGNOSIS — E780 Pure hypercholesterolemia: Secondary | ICD-10-CM | POA: Insufficient documentation

## 2014-08-24 DIAGNOSIS — E876 Hypokalemia: Secondary | ICD-10-CM | POA: Insufficient documentation

## 2014-08-24 DIAGNOSIS — J449 Chronic obstructive pulmonary disease, unspecified: Secondary | ICD-10-CM | POA: Diagnosis not present

## 2014-08-24 DIAGNOSIS — I10 Essential (primary) hypertension: Secondary | ICD-10-CM | POA: Insufficient documentation

## 2014-08-24 DIAGNOSIS — F319 Bipolar disorder, unspecified: Secondary | ICD-10-CM | POA: Diagnosis not present

## 2014-08-24 DIAGNOSIS — M79672 Pain in left foot: Secondary | ICD-10-CM | POA: Diagnosis present

## 2014-08-24 LAB — CBC WITH DIFFERENTIAL/PLATELET
BASOS ABS: 0 10*3/uL (ref 0.0–0.1)
BASOS PCT: 0 % (ref 0–1)
EOS ABS: 0.3 10*3/uL (ref 0.0–0.7)
EOS PCT: 3 % (ref 0–5)
HEMATOCRIT: 38.2 % (ref 36.0–46.0)
Hemoglobin: 12.5 g/dL (ref 12.0–15.0)
Lymphocytes Relative: 19 % (ref 12–46)
Lymphs Abs: 1.5 10*3/uL (ref 0.7–4.0)
MCH: 33.6 pg (ref 26.0–34.0)
MCHC: 32.7 g/dL (ref 30.0–36.0)
MCV: 102.7 fL — AB (ref 78.0–100.0)
MONO ABS: 0.8 10*3/uL (ref 0.1–1.0)
Monocytes Relative: 10 % (ref 3–12)
Neutro Abs: 5.3 10*3/uL (ref 1.7–7.7)
Neutrophils Relative %: 68 % (ref 43–77)
PLATELETS: 236 10*3/uL (ref 150–400)
RBC: 3.72 MIL/uL — AB (ref 3.87–5.11)
RDW: 13.7 % (ref 11.5–15.5)
WBC: 7.9 10*3/uL (ref 4.0–10.5)

## 2014-08-24 LAB — BASIC METABOLIC PANEL
ANION GAP: 6 (ref 5–15)
BUN: 11 mg/dL (ref 6–23)
CHLORIDE: 95 mmol/L — AB (ref 96–112)
CO2: 31 mmol/L (ref 19–32)
Calcium: 8.6 mg/dL (ref 8.4–10.5)
Creatinine, Ser: 0.92 mg/dL (ref 0.50–1.10)
GFR calc Af Amer: 82 mL/min — ABNORMAL LOW (ref >90)
GFR, EST NON AFRICAN AMERICAN: 71 mL/min — AB (ref >90)
Glucose, Bld: 154 mg/dL — ABNORMAL HIGH (ref 70–99)
POTASSIUM: 2.8 mmol/L — AB (ref 3.5–5.1)
Sodium: 132 mmol/L — ABNORMAL LOW (ref 135–145)

## 2014-08-24 LAB — RAPID STREP SCREEN (MED CTR MEBANE ONLY): Streptococcus, Group A Screen (Direct): NEGATIVE

## 2014-08-24 LAB — CK: Total CK: 181 U/L — ABNORMAL HIGH (ref 7–177)

## 2014-08-24 MED ORDER — POTASSIUM CHLORIDE CRYS ER 20 MEQ PO TBCR
40.0000 meq | EXTENDED_RELEASE_TABLET | Freq: Once | ORAL | Status: DC
  Filled 2014-08-24: qty 2

## 2014-08-24 MED ORDER — POTASSIUM CHLORIDE CRYS ER 20 MEQ PO TBCR: 20.0000 meq | EXTENDED_RELEASE_TABLET | Freq: Every day | ORAL | Status: AC

## 2014-08-24 NOTE — Discharge Instructions (Signed)
Dehydration, Adult Dehydration is when you lose more fluids from the body than you take in. Vital organs like the kidneys, brain, and heart cannot function without a proper amount of fluids and salt. Any loss of fluids from the body can cause dehydration.  CAUSES   Vomiting.  Diarrhea.  Excessive sweating.  Excessive urine output.  Fever. SYMPTOMS  Mild dehydration  Thirst.  Dry lips.  Slightly dry mouth. Moderate dehydration  Very dry mouth.  Sunken eyes.  Skin does not bounce back quickly when lightly pinched and released.  Dark urine and decreased urine production.  Decreased tear production.  Headache. Severe dehydration  Very dry mouth.  Extreme thirst.  Rapid, weak pulse (more than 100 beats per minute at rest).  Cold hands and feet.  Not able to sweat in spite of heat and temperature.  Rapid breathing.  Blue lips.  Confusion and lethargy.  Difficulty being awakened.  Minimal urine production.  No tears. DIAGNOSIS  Your caregiver will diagnose dehydration based on your symptoms and your exam. Blood and urine tests will help confirm the diagnosis. The diagnostic evaluation should also identify the cause of dehydration. TREATMENT  Treatment of mild or moderate dehydration can often be done at home by increasing the amount of fluids that you drink. It is best to drink small amounts of fluid more often. Drinking too much at one time can make vomiting worse. Refer to the home care instructions below. Severe dehydration needs to be treated at the hospital where you will probably be given intravenous (IV) fluids that contain water and electrolytes. HOME CARE INSTRUCTIONS   Ask your caregiver about specific rehydration instructions.  Drink enough fluids to keep your urine clear or pale yellow.  Drink small amounts frequently if you have nausea and vomiting.  Eat as you normally do.  Avoid:  Foods or drinks high in sugar.  Carbonated  drinks.  Juice.  Extremely hot or cold fluids.  Drinks with caffeine.  Fatty, greasy foods.  Alcohol.  Tobacco.  Overeating.  Gelatin desserts.  Wash your hands well to avoid spreading bacteria and viruses.  Only take over-the-counter or prescription medicines for pain, discomfort, or fever as directed by your caregiver.  Ask your caregiver if you should continue all prescribed and over-the-counter medicines.  Keep all follow-up appointments with your caregiver. SEEK MEDICAL CARE IF:  You have abdominal pain and it increases or stays in one area (localizes).  You have a rash, stiff neck, or severe headache.  You are irritable, sleepy, or difficult to awaken.  You are weak, dizzy, or extremely thirsty. SEEK IMMEDIATE MEDICAL CARE IF:   You are unable to keep fluids down or you get worse despite treatment.  You have frequent episodes of vomiting or diarrhea.  You have blood or green matter (bile) in your vomit.  You have blood in your stool or your stool looks black and tarry.  You have not urinated in 6 to 8 hours, or you have only urinated a small amount of very dark urine.  You have a fever.  You faint. MAKE SURE YOU:   Understand these instructions.  Will watch your condition.  Will get help right away if you are not doing well or get worse. Document Released: 06/07/2005 Document Revised: 08/30/2011 Document Reviewed: 01/25/2011 Exit[employer] Patient Information 2015 [employer]. This information is not intended to replace advice given to you by your health care provider. Make sure you discuss any questions you have with your health care   provider. Hypokalemia Hypokalemia means that the amount of potassium in the blood is lower than normal.Potassium is a chemical, called an electrolyte, that helps regulate the amount of fluid in the body. It also stimulates muscle contraction and helps nerves function properly.Most of the body's potassium is inside of  cells, and only a very small amount is in the blood. Because the amount in the blood is so small, minor changes can be life-threatening. CAUSES  Antibiotics.  Diarrhea or vomiting.  Using laxatives too much, which can cause diarrhea.  Chronic kidney disease.  Water pills (diuretics).  Eating disorders (bulimia).  Low magnesium level.  Sweating a lot. SIGNS AND SYMPTOMS  Weakness.  Constipation.  Fatigue.  Muscle cramps.  Mental confusion.  Skipped heartbeats or irregular heartbeat (palpitations).  Tingling or numbness. DIAGNOSIS  Your health care provider can diagnose hypokalemia with blood tests. In addition to checking your potassium level, your health care provider may also check other lab tests. TREATMENT Hypokalemia can be treated with potassium supplements taken by mouth or adjustments in your current medicines. If your potassium level is very low, you may need to get potassium through a vein (IV) and be monitored in the hospital. A diet high in potassium is also helpful. Foods high in potassium are:  Nuts, such as peanuts and pistachios.  Seeds, such as sunflower seeds and pumpkin seeds.  Peas, lentils, and lima beans.  Whole grain and bran cereals and breads.  Fresh fruit and vegetables, such as apricots, avocado, bananas, cantaloupe, kiwi, oranges, tomatoes, asparagus, and potatoes.  Orange and tomato juices.  Red meats.  Fruit yogurt. HOME CARE INSTRUCTIONS  Take all medicines as prescribed by your health care provider.  Maintain a healthy diet by including nutritious food, such as fruits, vegetables, nuts, whole grains, and lean meats.  If you are taking a laxative, be sure to follow the directions on the label. SEEK MEDICAL CARE IF:  Your weakness gets worse.  You feel your heart pounding or racing.  You are vomiting or having diarrhea.  You are diabetic and having trouble keeping your blood glucose in the normal range. SEEK IMMEDIATE  MEDICAL CARE IF:  You have chest pain, shortness of breath, or dizziness.  You are vomiting or having diarrhea for more than 2 days.  You faint. MAKE SURE YOU:   Understand these instructions.  Will watch your condition.  Will get help right away if you are not doing well or get worse. Document Released: 06/07/2005 Document Revised: 03/28/2013 Document Reviewed: 12/08/2012 Exit[employer] Patient Information 2015 [employer]. This information is not intended to replace advice given to you by your health care provider. Make sure you discuss any questions you have with your health care provider.  

## 2014-08-24 NOTE — ED Provider Notes (Signed)
CSN: [id number]     Arrival date & time 08/24/14  [id number] History   First MD Initiated Contact with Patient 08/24/14 [phone]     Chief Complaint  Patient presents with  . multiple complaints      (Consider location/radiation/quality/duration/timing/severity/associated sxs/prior Treatment) Patient is a 54 y.o. female presenting with pharyngitis and leg pain.  Sore Throat This is a new problem. Episode onset: 2 weeks ago, worsened over last 3 days. The problem occurs constantly. The problem has not changed since onset.Pertinent negatives include no chest pain, no abdominal pain, no headaches and no shortness of breath. The symptoms are aggravated by swallowing. Nothing relieves the symptoms.  Leg Pain Lower extremity pain location: bil le, more prominent in calf. Injury: no   Pain details:    Quality:  Cramping   Radiates to:  Does not radiate   Severity:  Moderate   Onset quality:  Gradual   Duration:  2 weeks   Timing:  Constant   Progression:  Worsening Relieved by: massaging. Worsened by:  Bearing weight Associated symptoms: tingling   Associated symptoms: no back pain, no decreased ROM, no fever, no numbness and no swelling     Past Medical History  Diagnosis Date  . Fibromyalgia   . COPD (chronic obstructive pulmonary disease)   . Arthritis   . Hypertension   . High cholesterol   . Bipolar disorder    Past Surgical History  Procedure Laterality Date  . Carpal tunnel release    . Appendectomy    . Breast lumpectomy    . Tubal ligation    . Knee surgery    . Clavicle surgery     No family history on file. History  Substance Use Topics  . Smoking status: Current Every Day Smoker -- 1.00 packs/day    Types: Cigarettes  . Smokeless tobacco: Not on file  . Alcohol Use: No   OB History    No data available     Review of Systems  Constitutional: Negative for fever.  Respiratory: Negative for shortness of breath.   Cardiovascular: Negative for chest pain.   Gastrointestinal: Negative for abdominal pain.  Musculoskeletal: Negative for back pain.  Neurological: Negative for headaches.  All other systems reviewed and are negative.     Allergies  Asa; Doxycycline; Geodon; Hydrocodone; Morphine and related; Penicillins; Prozac; Tetracyclines & related; Tramadol; and Zoloft  Home Medications   Prior to Admission medications   Medication Sig Start Date End Date Taking? Authorizing Provider  albuterol (PROVENTIL HFA;VENTOLIN HFA) 108 (90 BASE) MCG/ACT inhaler Inhale 2 puffs into the lungs every 6 (six) hours as needed for wheezing.    Historical Provider, MD  clindamycin (CLEOCIN) 300 MG capsule Take 1 capsule (300 mg total) by mouth every 6 (six) hours. 05/03/14   Ezequiel Essex, MD  cyclobenzaprine (FLEXERIL) 10 MG tablet Take 1 tablet (10 mg total) by mouth 3 (three) times daily as needed for muscle spasms. 01/10/13   John L Molpus, MD  DULoxetine (CYMBALTA) 60 MG capsule Take 60 mg by mouth daily.    Historical Provider, MD  hydrochlorothiazide (HYDRODIURIL) 25 MG tablet Take 1 tablet (25 mg total) by mouth daily. 05/13/14   Fransico Meadow, PA-C  ibuprofen (ADVIL,MOTRIN) 800 MG tablet Take 1 tablet (800 mg total) by mouth 3 (three) times daily. 09/01/12   Fransico Meadow, PA-C  lisinopril (PRINIVIL,ZESTRIL) 10 MG tablet Take 1 tablet (10 mg total) by mouth daily. 05/13/14   Fransico Meadow, PA-C  oxyCODONE-acetaminophen (PERCOCET/ROXICET) 5-325 MG per tablet Take 2 tablets by mouth every 4 (four) hours as needed for severe pain. 05/13/14   Fransico Meadow, PA-C  potassium chloride SA (K-DUR,KLOR-CON) 20 MEQ tablet Take 1 tablet (20 mEq total) by mouth daily. 08/24/14   Debby Freiberg, MD  rosuvastatin (CRESTOR) 10 MG tablet Take 10 mg by mouth daily.    Historical Provider, MD   BP 131/73 mmHg  Pulse 96  Temp(Src) 97.9 F (36.6 C) (Oral)  Resp 20  Ht 5\' 1"  (1.549 m)  Wt 200 lb (90.719 kg)  BMI 37.81 kg/m2  SpO2 100% Physical Exam   Constitutional: She is oriented to person, place, and time. She appears well-developed and well-nourished.  HENT:  Head: Normocephalic and atraumatic.  Right Ear: External ear normal.  Left Ear: External ear normal.  Eyes: Conjunctivae and EOM are normal. Pupils are equal, round, and reactive to light.  Neck: Normal range of motion. Neck supple.  Cardiovascular: Normal rate, regular rhythm, normal heart sounds and intact distal pulses.   Pulmonary/Chest: Effort normal and breath sounds normal.  Abdominal: Soft. Bowel sounds are normal. There is no tenderness.  Musculoskeletal: Normal range of motion.       Right lower leg: She exhibits tenderness (mild). She exhibits no swelling.       Left lower leg: She exhibits tenderness (mild). She exhibits no swelling.  Pain over 2-3 toes, 2+ DP and PT pulses bilaterally, no rash, <2 sec cap refill  Neurological: She is alert and oriented to person, place, and time.  Skin: Skin is warm and dry.  Vitals reviewed.   ED Course  Procedures (including critical care time) Labs Review Labs Reviewed  CBC WITH DIFFERENTIAL/PLATELET - Abnormal; Notable for the following:    RBC 3.72 (*)    MCV 102.7 (*)    All other components within normal limits  BASIC METABOLIC PANEL - Abnormal; Notable for the following:    Sodium 132 (*)    Potassium 2.8 (*)    Chloride 95 (*)    Glucose, Bld 154 (*)    GFR calc non Af Amer 71 (*)    GFR calc Af Amer 82 (*)    All other components within normal limits  CK - Abnormal; Notable for the following:    Total CK 181 (*)    All other components within normal limits  RAPID STREP SCREEN  CULTURE, GROUP A STREP    Imaging Review Dg Foot Complete Left  08/24/2014   CLINICAL DATA:  Left foot pain.  EXAM: LEFT FOOT - COMPLETE 3+ VIEW  COMPARISON:  None.  FINDINGS: No fracture or dislocation identified. No significant arthropathy. No bony lesion or destruction. Soft tissues are unremarkable.  IMPRESSION: Normal left  foot.   Electronically Signed   By: Aletta Edouard M.D.   On: 08/24/2014 10:21     EKG Interpretation None      MDM   Final diagnoses:  Left foot pain  Myalgia  Hypokalemia    54 y.o. female with pertinent PMH of fibromyalgia, COPD presents with dual complaint of sore throat and bil cramping leg pain in setting of GI illness 2 weeks ago.  Pain worsened over last 3 days.  No recent fevers, dyspnea, or increase in chronic cough.  Exam on arrival as above.  No signs of infection in toe, but isolated tenderness present without skin changes.  Wu with hypokalemia, likely from diarrhea 2 weeks ago.  Discussed results and pt to  take PO K supplements, encouraged to increase fluid intake, and to fu with PCP.  DC home in stable condition.  I have reviewed all laboratory and imaging studies if ordered as above  1. Myalgia   2. Left foot pain   3. Hypokalemia         Debby Freiberg, MD 08/24/14 1046

## 2014-08-24 NOTE — ED Notes (Addendum)
Patient reports that she has multiple complaints. Had GI bug a week ago and now experiencing bilateral leg pain, right knee pain, left foot pain and sore throat. Raised areas noted to tongue, reports decreased appetite for the past few days due to her sore throat

## 2014-08-26 LAB — CULTURE, GROUP A STREP

## 2014-10-13 NOTE — Discharge Summary (Signed)
PATIENT NAME:  Leslie King, Leslie King MR#:  [id number] DATE OF BIRTH:  1961/01/13  DATE OF ADMISSION:  06/25/2011 DATE OF DISCHARGE:  06/29/2011  HOSPITAL COURSE: See dictated history and physical for details. This 54 year old woman with a history of depression came to the hospital reporting suicidal ideation. In the hospital, she was treated with daily individual supportive therapy as well as involvement in group therapy. She had some adjustment to her antidepressant medicine. Overall her mood improved. She did not report any suicidal ideation during her time in the hospital and at the time of discharge was denying suicidal ideation. She was agreeable to being set up for follow-up care in the community for outpatient psychiatric care. There was no indication of psychosis. She tolerated medicines well. She was given some p.r.n. zolpidem which helped with sleep. Her medical problems were addressed via a consultation for open wounds on her breasts. Ultrasound was done which did not show any deep tissue involvement. She was put on antibiotics and will be followed up in the community.   DISCHARGE MEDICATIONS:  1. Ambien 10 mg p.o. at bedtime p.r.n.  2. Potassium chloride 20 mEq p.o. daily.  3. Levaquin 500 mg p.o. daily.  4. Cymbalta 30 mg twice a day. 5. Crestor 10 mg at bedtime.  6. Prilosec 20 mg p.o. twice a day. 7. Hydrochlorothiazide 25 mg daily.  8. Prozac was discontinued.  9. Albuterol inhaler 2 puffs every 4 hours p.r.n. for shortness of breath.   LABS/STUDIES: Ultrasound of the left breast did not show an abscess.   Thyroid stimulating hormone was a little bit low at 0.127, albumin low at 2.9, potassium low at 3.3, and glucose 114. CBC showed a low platelet count at 146.   Staphylococcus growth taken from the wound susceptible to multiple antibiotics.  Drug screen positive for opiates, indicating her pain medicine.   Urinalysis was not likely indicating a urinary tract infection.  A TSH  done just prior to admission was 0.463. Pregnancy test was negative.   MENTAL STATUS EXAM AT DISCHARGE: Alert and oriented x4. Casually dressed, neatly groomed. Good eye contact, normal psychomotor activity. Normal speech pattern. Affect euthymic and reactive. Mood stated as being better. Thoughts are lucid and directed with no evidence of loosening of associations. Denies suicidal or homicidal ideation. Shows improved judgment and insight.   DISPOSITION: Discharge back home for follow-up in the community.   DIAGNOSIS PRINCIPLE AND PRIMARY:   AXIS I: Depression, not otherwise specified.   SECONDARY DIAGNOSES:   AXIS I: No further.   AXIS II: Some borderline traits noted.   AXIS III: Acute ulcers on the breasts, chronic obstructive pulmonary disease, hypertension, hypothyroid.   AXIS IV: Moderate/chronic - Social stress.   AXIS V: Functioning at time of discharge 55.  ____________________________ Gonzella Lex, MD jtc:slb D: 07/19/2011 16:34:11 ET     T: 07/20/2011 10:34:35 ET        JOB#: [id number] cc: Gonzella Lex, MD, <Dictator> Gonzella Lex MD ELECTRONICALLY SIGNED 07/21/2011 11:33

## 2014-10-13 NOTE — Consult Note (Signed)
PATIENT NAME:  Leslie King, Leslie King#:  [id number] DATE OF BIRTH:  [birth year]  DATE OF CONSULTATION:  06/28/2011  REFERRING PHYSICIAN: Mena Pauls, MD  CONSULTING PHYSICIAN: Jerrol Banana. Burt Knack, M.D.   CHIEF COMPLAINT: "Breast abscess".   HISTORY OF PRESENT ILLNESS: I was asked to see this patient for possible breast abscess by Dr. Lyndel Safe who is in turn consulted by the psychiatry service. The patient has been admitted to the Behavioral Medicine Unit for major depression and suicidal ideations.   The patient describes over two years of multiple draining sites from her left breast. In further questioning however, it has been present on the right breast as well and she has had similar other skin processes on her arms, face, neck and chest over the last two years. Apparently, she has grown MSSA in the past. She states she had a mammogram that was normal in February 2012 in [place].   She denies nipple discharge, breast mass, or breast pain.   Denies fevers or chills.   PAST MEDICAL HISTORY:  1. Morbid obesity.  2. Major depression.  3. Hypercholesterolemia.  4. Hypertension.   PAST SURGICAL HISTORY: Right breast biopsy many years ago which was benign.   GYN HISTORY: G1, P1, Ab0.   FAMILY HISTORY: Both her father and her father's aunt had breast cancer.   ADDITIONAL HISTORY: The patient does do regular self exams and has not noticed any new masses or changes other than these excoriations.   SOCIAL HISTORY: The patient smokes tobacco, two packs a day. She does not drink alcohol and is unemployed.  REVIEW OF SYSTEMS: A 10 system review has been performed and negative with the exception of that mentioned in the history of present illness.   PHYSICAL EXAMINATION:  GENERAL: Morbidly obese female patient, 61 inches tall and 215 pounds with a BMI of 41.   HEENT: Poor dentition.   NECK: No palpable neck nodes.   CHEST: Clear to auscultation.   CARDIAC: Regular rate and rhythm.    ABDOMEN: Soft, nontender.   EXTREMITIES: Moderate edema.   NEUROLOGIC: Grossly intact.   INTEGUMENT: No jaundice. However the integument shows multiple healed 1 to 2 cm circular scars. These are present mostly on her left neck, jaw line, abdomen, chest, and left arm.   BREASTS: Similar excoriations in various stages of healing. Some of which are completely healed as were the ones on her left neck. Others are open on both breasts with granulation tissue, very superficial in nature. No expressible purulence. Essentially dermal thickness only, much like a burn wound although there were multiple one both breasts. No mass in either breast. No axillary adenopathy. No expressible nipple discharge in the other breast. (This exam was performed in the presence of a R.N. from [place] acting as a chaperone.)   LABORATORY, DIAGNOSTIC, AND RADIOLOGICAL DATA: No recent labs are available for review.   An ultrasound is personally reviewed performed today. There is no sign of breast mass, cyst, or abscess. This was done on the left side.   ASSESSMENT AND PLAN: I was asked to see the patient for possible breast abscess. The condition that the patient has is really a dermatologic condition. It has nothing to do with the breast itself except that it happens to be on the skin of the breast. She has had this multiple times in the other portions of her body including arm, chest, abdomen, face and neck. She is due for a mammogram next month. I would recommend that she  have a routine screening mammogram especially understanding that she has a family history of breast cancer both in a female and a female member. That can be arranged either through her clinic or her primary care physician. She does not require general surgical follow-up but she should see a dermatologist for this condition where this may be a vasculitis or something causing necrosis of the superficial layer of the skin that is beyond my expertise.  This will be discussed with Dr. Lyndel Safe as well.   ____________________________ Jerrol Banana. Burt Knack, MD rec:rbg D: 06/28/2011 16:46:36 ET T: 06/29/2011 08:44:15 ET JOB#: [id number]  cc: Jerrol Banana. Burt Knack, MD, <Dictator> Florene Glen MD ELECTRONICALLY SIGNED 06/29/2011 17:22
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
## 2014-10-13 NOTE — Consult Note (Signed)
PATIENT NAME:  Leslie King, Leslie King MR#:  [id number] DATE OF BIRTH:  16-Oct-1960  DATE OF CONSULTATION:  06/27/2011  REFERRING PHYSICIAN:  Dr. Weber Cooks  CONSULTING PHYSICIAN:  Cherre Huger, MD  REASON FOR CONSULTATION: Sores on patient's breasts.    HISTORY OF PRESENT ILLNESS: Patient is a 54 year old female with past medical history of hypertension, asthma, chronic pain, hyperlipidemia, fibromyalgia who is currently admitted to [place] for depression/suicidal ideation. Medicine was consulted because patient has open ulcers on both her breasts. Patient reports that she gets frequent skin infections which start as small boils and open up to form shallow ulcers. She has had multiple rounds of antibiotics in the past. Patient reports that for more than a year she has been having on and off infection in both her breasts. It usually starts off as a small boil. She reports that she does not usually pick on them but could have picked on a few of them while sleeping resulting in open sores. She has very shallow ulcer on the right breast but actively oozing, two actively oozing ulcers on her left breast, and her left breast is the one which is giving her the most problem because it is swollen and red. Patient is unable to wear a bra because of the constant recurrent infections. She reports that she has had mammograms done about a year ago and did not have any abnormal calcifications. Her paternal grandmother had breast cancer. She is afebrile with a normal white count.   ALLERGIES: Lyrica, penicillin, non-steroidal anti-inflammatory medications, morphine Seroquel, Tegretol, Geodon.   CURRENT MEDICATIONS:  1. Tylenol p.r.n.  2. Albuterol 2 puffs q.4 hours p.r.n.  3. Mylanta p.r.n.  4. Symbicort 2 puffs b.i.d.  5. HCTZ 25 mg daily.  6. Ibuprofen q.6 hours p.r.n.  7. Magnesium oxide 30 mL at bedtime p.r.n. for constipation. 8. Nicotine inhaler q.1 hour.  9. Omeprazole 20 mg b.i.d.  10. Crestor 10  mg at bedtime. 11. Benadryl 50 mg at bedtime p.r.n. for sleep.  12. Cymbalta 30 mg b.i.d.   PAST MEDICAL HISTORY: 1. Hypertension. 2. Asthma. 3. Fibromyalgia.  4. Osteoarthritis.  5. Hyperlipidemia.  6. Chronic pain syndrome.   FAMILY HISTORY: Positive for depression. Mother had porphyria and neuropathy. Father had hypertension and chronic obstructive pulmonary disease.  SOCIAL HISTORY: Patient gives history of smoking 2 packs per day for more than 20 years. Denies any alcohol use. Has used marijuana in the past. She lived with her boyfriend before getting admitted to the hospital.    REVIEW OF SYSTEMS: CONSTITUTIONAL: Denies any weight changes, fever, weakness, or fatigue. EYES: Denies any blurred or double vision. ENT: Denies any tinnitus, ear pain. RESPIRATORY: Denies any chest pain, tachycardia, palpitations. CARDIOVASCULAR: Denies any tachycardia, palpitations. RESPIRATORY: Denies any painful respiration, wheezing, or cough. GASTROINTESTINAL: Denies any nausea, vomiting, diarrhea. GENITOURINARY: Denies any nocturia or pyuria. MUSCULOSKELETAL: Has arthritis, fibromyalgia. INTEGUMENTARY: Reports frequent episodes of boils which break down to form superficial ulcers for which patient has received multiple courses of antibiotics. NEUROLOGICAL: Denies any fainting, blackouts, seizures, paralysis. PSYCH: Is admitted to Behavior Medicine for depression and suicidal ideation. ENDOCRINE: Denies any thyroid problems, heat or cold intolerance. HEME/LYMPH: Denies any anemia, easy bruisability.   PHYSICAL EXAMINATION:  VITAL SIGNS: Temperature 96.2, heart rate 81, respiratory rate 20, blood pressure 110/81.   GENERAL: Patient is a middle-aged Caucasian female who is overweight sitting comfortably in bed, not in acute distress.   HEAD: Atraumatic, normocephalic.   EYES: No icterus or cyanosis. Pupils  equal, round, and reactive to light and accommodation. Extraocular movements intact.   ENT: Wet  mucous membranes. No oropharyngeal erythema or thrush.   NECK: Supple. No masses. No JVD. No thyromegaly. No lymphadenopathy.   CHEST WALL: No tenderness to palpation. Not using accessory muscles of respiration. No intercostal muscle retractions.    LUNGS: Bilaterally clear to auscultation. No wheezing, rales, or rhonchi.  CARDIOVASCULAR: S1, S2 regular. No murmur, rubs, or gallops.   ABDOMEN: Soft, nontender, nondistended. No guarding. No rigidity. Normal bowel sounds.   PERIPHERIES: No pedal edema. 2+ pedal pulses.   MUSCULOSKELETAL: No cyanosis or clubbing.   NEUROLOGIC: Awake, alert, oriented x3. Nonfocal neurological exam.   PSYCH: Normal mood and affect.   SKIN: On her upper extremities patient has scarring from prior superficial ulcers. No other rashes or lesions. Right breast the lateral surface is very indurated and there are two superficial open ulcers which are nondraining, mildly tender. Left breast on the medial surface there is really indurated erythematous, tender to touch showing signs of active inflammation. There are two shallow ulcers with some purulent discharge. The whole breast is very, very tender to touch.  LABORATORY, DIAGNOSTIC AND RADIOLOGICAL DATA: Wound culture sent on 01/03 show MSSA which is pansensitive. White count is normal. Hemoglobin is normal. Platelet count 149, glucose 121, BUN 8, creatinine 0.92, sodium 140, potassium 2.3, chloride 101, CO2 32. Normal LFTs. Urine drug screen was positive for opiates.   ASSESSMENT AND PLAN: 54 year old female with history of hypertension, asthma, fibromyalgia, recurrent skin infections admitted to Behavioral Medicine for depression, suicidal ideation. Medicine consulted for bilateral breast ulcers.  1. Bilateral mastitis with open ulcers. Patient has evidence of mastitis on both her breasts. Her wound culture is showing MSSA. Penicillin would have been a very good choice to treat this kind of infection; however, patient  is allergic to penicillin therefore will treat patient with Levaquin. Will also recommend changing dressing daily and covering with gauze to prevent further irritation, infection and worsening of the ulcers. Will obtain an ultrasound the left breast because that is more severely infected to rule out any underlying fluid collection since patient is having purulent discharge from her ulcers.  2. Thrombocytopenia, isolated.  Should monitor closely.  3. Hypertension. This is overall controlled. Continue patient's current medications. 4. History of asthma. There is no evidence of any acute exacerbation. Continue Symbicort and albuterol.  5. Hyperglycemia, possibly reactive. Continue to monitor. 6. Hypokalemia. This is likely related to patient's use of HCTZ. Would add oral potassium supplementation.  7. Hyperlipidemia. Continue patient's Crestor.   8. Smoking. Patient was counseled about cessation for more than three minutes.    TOTAL TIME SPENT: 75 minutes.   ____________________________ Cherre Huger, MD sp:cms D: 06/27/2011 16:38:07 ET T: 06/28/2011 08:06:19 ET JOB#: [id number]  cc: Cherre Huger, MD, <Dictator> Cherre Huger MD ELECTRONICALLY SIGNED 06/28/2011 12:16

## 2014-10-13 NOTE — Consult Note (Signed)
Brief Consult Note: Diagnosis: skin excoriations.   Patient was seen by consultant.   Consult note dictated.   Recommend further assessment or treatment.   Comments: Dermatologic condition. Needs to see dermatologist for this and have a routine screening mammogram through clinic/PCP in February. No need for GS f/u.  Electronic Signatures: Florene Glen (MD)  (Signed 07-Jan-13 16:40)  Authored: Brief Consult Note   Last Updated: 07-Jan-13 16:40 by Florene Glen (MD)

## 2015-10-16 DIAGNOSIS — F329 Major depressive disorder, single episode, unspecified: Secondary | ICD-10-CM

## 2015-10-16 DIAGNOSIS — J449 Chronic obstructive pulmonary disease, unspecified: Secondary | ICD-10-CM

## 2015-10-16 DIAGNOSIS — K219 Gastro-esophageal reflux disease without esophagitis: Secondary | ICD-10-CM

## 2015-10-16 DIAGNOSIS — G8929 Other chronic pain: Secondary | ICD-10-CM

## 2015-10-16 DIAGNOSIS — M542 Cervicalgia: Secondary | ICD-10-CM

## 2015-10-16 DIAGNOSIS — F32A Depression, unspecified: Secondary | ICD-10-CM

## 2015-10-16 DIAGNOSIS — M549 Dorsalgia, unspecified: Principal | ICD-10-CM

## 2016-08-11 IMAGING — CR DG CHEST 2V
1 series · 2 of 2 positions shown · non-contrast
Comparison: None.

CLINICAL DATA: COPD

EXAM:
CHEST  2 VIEW

[Series 1: w chest pa · 0.14mm/px · 2 of 2 slices shown]
[im 1/2]
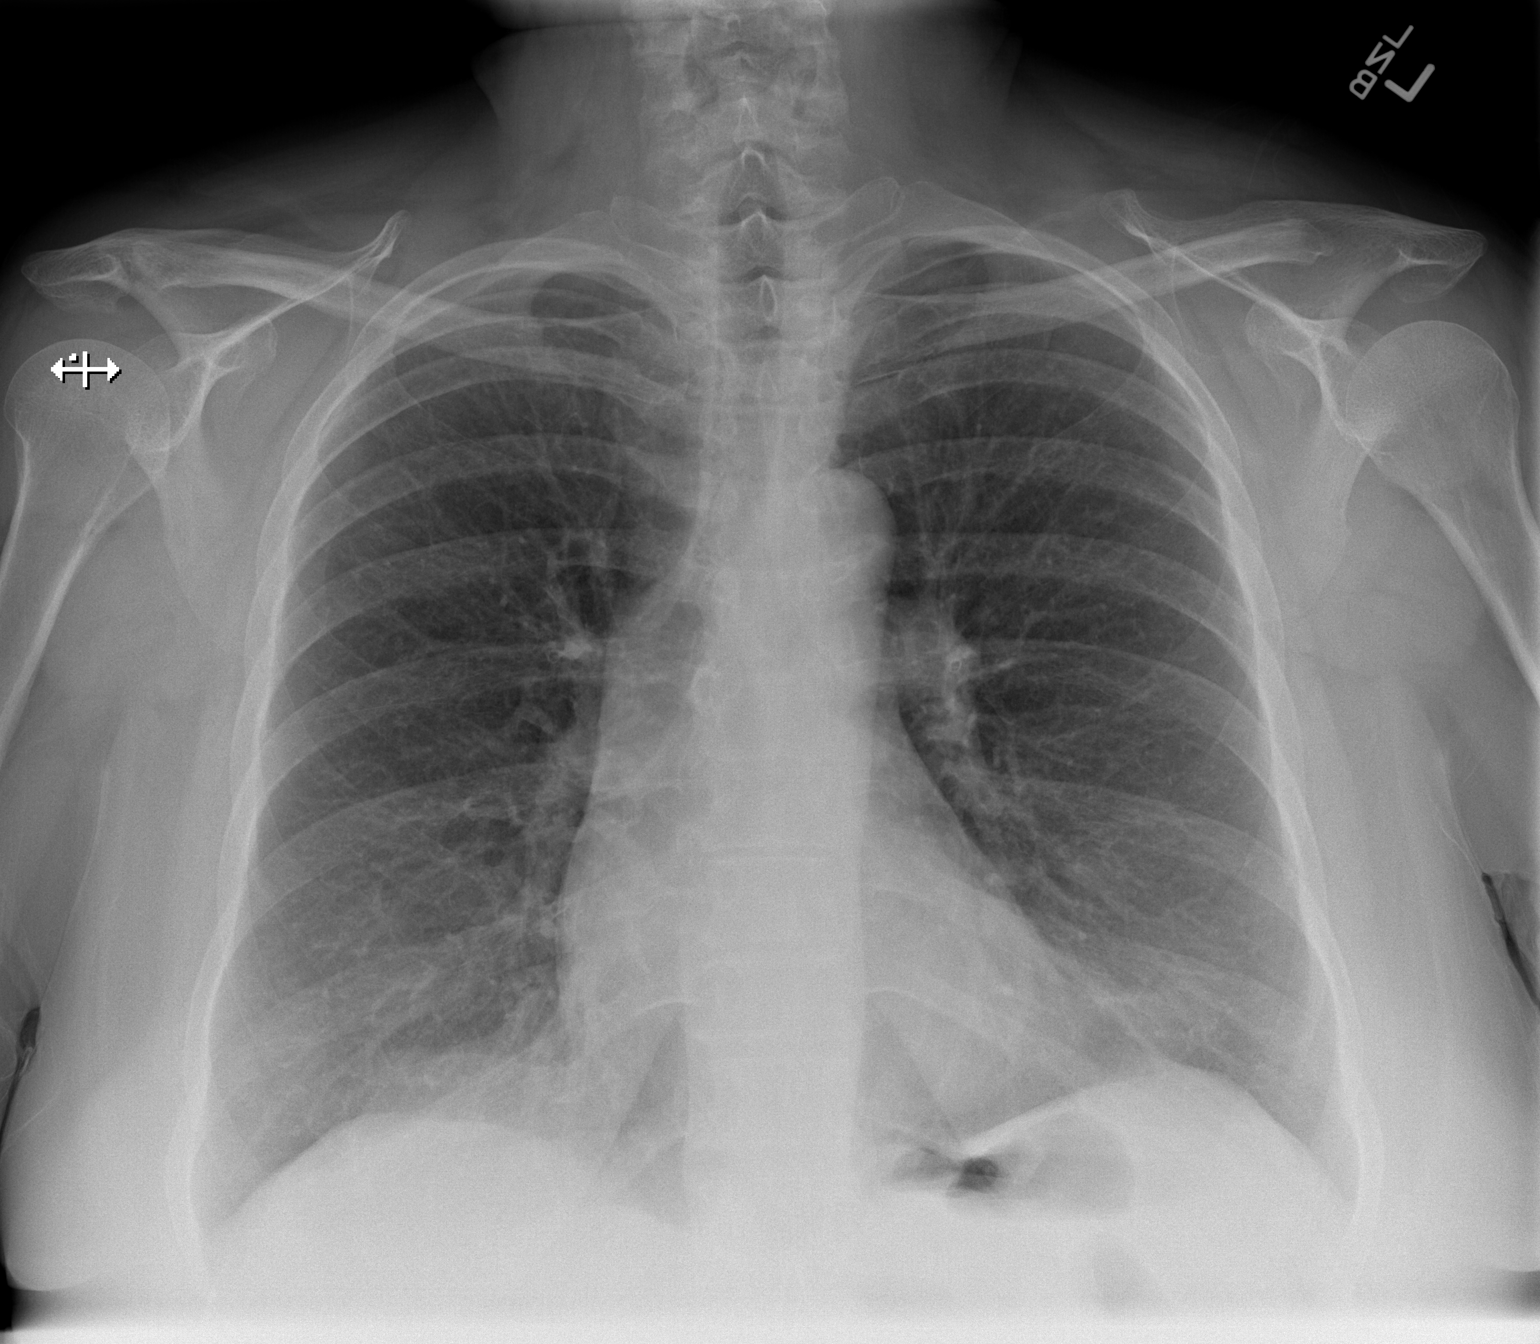
[im 2/2]
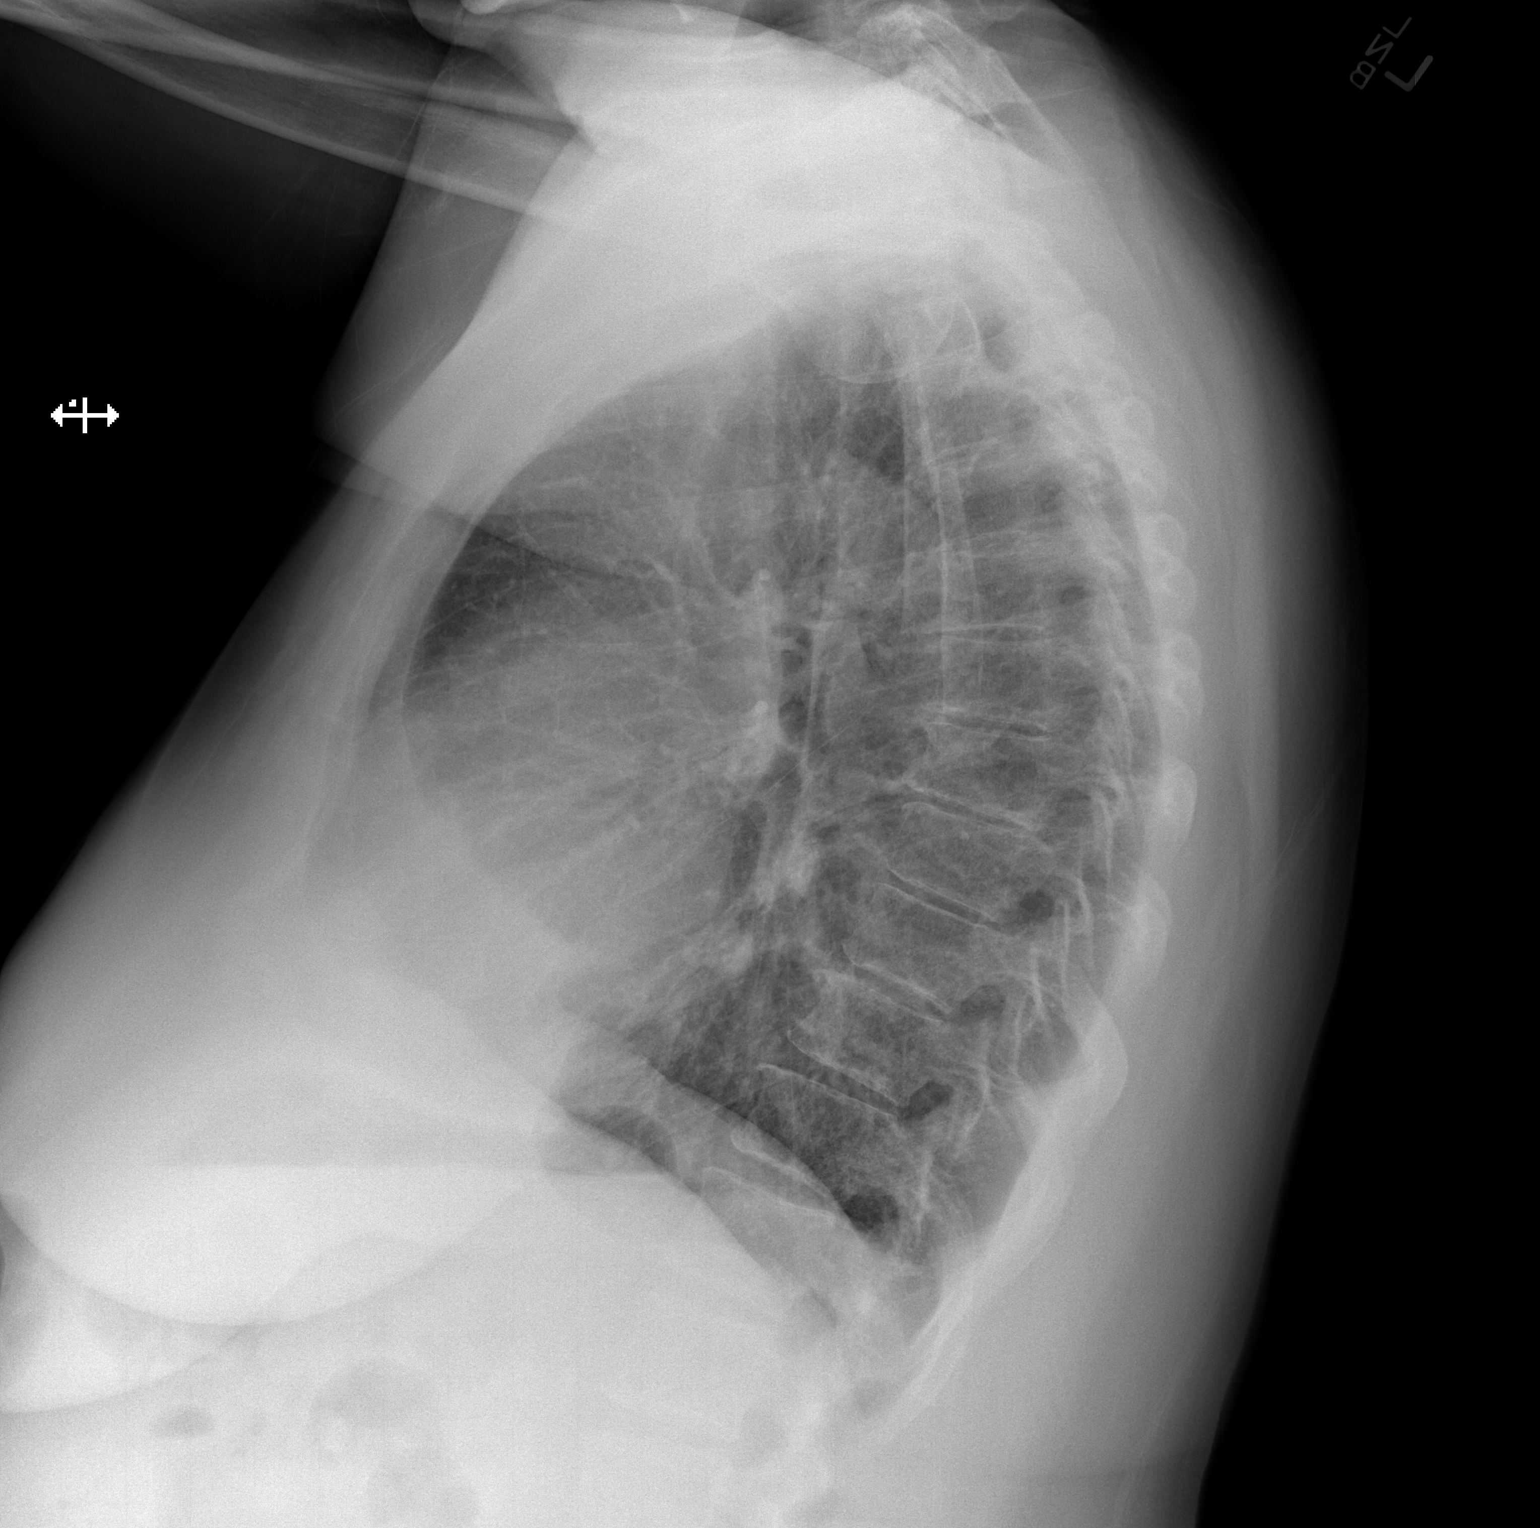

[2 of 2 positions shown; findings below may reference images not displayed]

FINDINGS: There is no appreciable edema or consolidation. Heart size and
pulmonary vascularity are normal. There is mild prominence of
epicardial fat along the right heart border. No adenopathy. There is
mild degenerative change in the thoracic spine. There is evidence of
resorption of the lateral left clavicle with widening of the
acromioclavicular joint.
IMPRESSION: Evidence of resorption of the lateral left clavicle of uncertain
etiology. No lung edema or consolidation.
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
## 2019-07-15 ENCOUNTER — Emergency Department (HOSPITAL_BASED_OUTPATIENT_CLINIC_OR_DEPARTMENT_OTHER)
Admission: EM | Admit: 2019-07-15 | Discharge: 2019-07-15 | Disposition: A | Discharge status: Home / Self Care | Payer: Medicare Other | Attending: Emergency Medicine | Admitting: Emergency Medicine

## 2019-07-15 ENCOUNTER — Encounter (HOSPITAL_BASED_OUTPATIENT_CLINIC_OR_DEPARTMENT_OTHER): Payer: Self-pay

## 2019-07-15 ENCOUNTER — Other Ambulatory Visit: Payer: Self-pay

## 2019-07-15 DIAGNOSIS — W2203XA Walked into furniture, initial encounter: Secondary | ICD-10-CM | POA: Diagnosis not present

## 2019-07-15 DIAGNOSIS — J449 Chronic obstructive pulmonary disease, unspecified: Secondary | ICD-10-CM | POA: Diagnosis not present

## 2019-07-15 DIAGNOSIS — Y999 Unspecified external cause status: Secondary | ICD-10-CM | POA: Insufficient documentation

## 2019-07-15 DIAGNOSIS — Y939 Activity, unspecified: Secondary | ICD-10-CM | POA: Insufficient documentation

## 2019-07-15 DIAGNOSIS — Z79899 Other long term (current) drug therapy: Secondary | ICD-10-CM | POA: Insufficient documentation

## 2019-07-15 DIAGNOSIS — Z87891 Personal history of nicotine dependence: Secondary | ICD-10-CM | POA: Diagnosis not present

## 2019-07-15 DIAGNOSIS — I120 Hypertensive chronic kidney disease with stage 5 chronic kidney disease or end stage renal disease: Secondary | ICD-10-CM | POA: Diagnosis not present

## 2019-07-15 DIAGNOSIS — S81811A Laceration without foreign body, right lower leg, initial encounter: Secondary | ICD-10-CM | POA: Insufficient documentation

## 2019-07-15 DIAGNOSIS — S8991XA Unspecified injury of right lower leg, initial encounter: Secondary | ICD-10-CM | POA: Diagnosis present

## 2019-07-15 DIAGNOSIS — Y929 Unspecified place or not applicable: Secondary | ICD-10-CM | POA: Insufficient documentation

## 2019-07-15 DIAGNOSIS — Z992 Dependence on renal dialysis: Secondary | ICD-10-CM | POA: Insufficient documentation

## 2019-07-15 DIAGNOSIS — N186 End stage renal disease: Secondary | ICD-10-CM | POA: Diagnosis not present

## 2019-07-15 HISTORY — DX: End stage renal disease: N18.6

## 2019-07-15 HISTORY — DX: Hypersensitivity angiitis: M31.0

## 2019-07-15 MED ORDER — LIDOCAINE-EPINEPHRINE (PF) 2 %-1:200000 IJ SOLN
10.0000 mL | Freq: Once | INTRAMUSCULAR | Status: AC
  Administered 2019-07-15: 10 mL
  Filled 2019-07-15: qty 10

## 2019-07-15 MED ORDER — OXYCODONE HCL 5 MG PO TABS
5.0000 mg | ORAL_TABLET | Freq: Once | ORAL | Status: AC
  Administered 2019-07-15: 15:00:00 5 mg via ORAL
  Filled 2019-07-15: qty 1

## 2019-07-15 MED ORDER — BUPIVACAINE HCL (PF) 0.5 % IJ SOLN
10.0000 mL | Freq: Once | INTRAMUSCULAR | Status: AC
  Administered 2019-07-15: 10 mL
  Filled 2019-07-15: qty 10

## 2019-07-15 NOTE — ED Notes (Signed)
ED Provider at bedside. 
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
## 2019-07-15 NOTE — ED Provider Notes (Signed)
[facility] EMERGENCY DEPARTMENT Provider Note   CSN: [id number] Arrival date & time: 07/15/19  1417     History Chief Complaint  Patient presents with  . Laceration    Leslie King is a 59 y.o. female.  HPI      Leslie King is a 59 y.o. female, with a history of COPD, bipolar, fibromyalgia, hypercholesterolemia, HTN, Goodpasture disease, ESRD on dialysis, presenting to the ED with laceration to the right lower leg that occurred around 11:30 AM today. States she accidentally hit the leg on her bedside table while trying to get out of bed. She complains of soreness to the leg, throbbing, moderate in intensity, nonradiating. She states she has had a tetanus update in the last 10 years. Denies numbness, weakness, fall, head injury, neck/back pain, ankle pain, knee pain, or any other complaints.   Past Medical History:  Diagnosis Date  . Arthritis   . Bipolar disorder ([place])   . Bone spur of foot   . COPD (chronic obstructive pulmonary disease) ([place])   . Degenerative disorder of bone   . Endometriosis   . ESRD (end stage renal disease) on dialysis ([place])    Secondary to Goodpasture disease/Anti-GBM  . Fibromyalgia   . 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 High cholesterol   . Hypertension     Patient Active Problem List   Diagnosis Date Noted  . Chronic back pain 01/22/2014  . Chronic neck pain 01/22/2014  . GERD (gastroesophageal reflux disease) 01/22/2014  . COPD (chronic obstructive pulmonary disease) ([place]) 01/22/2014  . Depression 01/22/2014    Past Surgical History:  Procedure Laterality Date  . APPENDECTOMY  1995  . BREAST LUMPECTOMY    . CARPAL TUNNEL RELEASE    . CESAREAN SECTION  1988  . CLAVICLE SURGERY    . KNEE SURGERY    . TUBAL LIGATION       OB History   No obstetric history on file.     Family History  Problem Relation Age of Onset  . Leukemia Mother   . Cancer Father        Throat  . Hypertension Father   . Diabetes  Maternal Grandfather   . Cancer Paternal Grandmother        Breast  . Diabetes Paternal Grandfather     Social History   Tobacco Use  . Smoking status: Former Smoker    Packs/day: 1.00    Types: Cigarettes    Quit date: 05/27/2019    Years since quitting: 0.1  . Smokeless tobacco: Never Used  Substance Use Topics  . Alcohol use: No  . Drug use: No    Home Medications Prior to Admission medications   Medication Sig Start Date End Date Taking? Authorizing Provider  calcium citrate (CALCITRATE - DOSED IN MG ELEMENTAL CALCIUM) 950 (200 Ca) MG tablet Take by mouth. 06/20/19  Yes [provider]  DULoxetine (CYMBALTA) 60 MG capsule Take by mouth. 10/04/17  Yes [provider]  lisinopril (ZESTRIL) 10 MG tablet Hold until follow-up. Previous dose: 10mg  once daily 11/20/14  Yes [provider]  sulfamethoxazole-trimethoprim (BACTRIM) 400-80 MG tablet Take by mouth. 07/04/19  Yes [provider]  albuterol (PROVENTIL HFA;VENTOLIN HFA) 108 (90 BASE) MCG/ACT inhaler Inhale 2 puffs into the lungs every 6 (six) hours as needed for wheezing.    [provider]  atorvastatin (LIPITOR) 80 MG tablet Take 80 mg by mouth daily. 05/25/19   [provider]  Cholecalciferol (VITAMIN  D3) 25 MCG (1000 UT) CAPS  06/20/19   [provider]  clindamycin (CLEOCIN) 300 MG capsule Take 1 capsule (300 mg total) by mouth every 6 (six) hours. 05/03/14   Rancour, Annie Main, MD  clopidogrel (PLAVIX) 75 MG tablet Take 75 mg by mouth daily. 05/25/19   [provider]  cyclobenzaprine (FLEXERIL) 10 MG tablet Take 1 tablet (10 mg total) by mouth 3 (three) times daily as needed for muscle spasms. 01/10/13   Molpus, John, MD  cyclophosphamide (CYTOXAN) 25 MG capsule  06/20/19   [provider]  DULoxetine (CYMBALTA) 60 MG capsule Take 60 mg by mouth daily.    [provider]  furosemide (LASIX) 80 MG tablet Take 1 tablet (80 mg total) by mouth  See Admin Instructions. Take on non dialysis days (Mon, Wed, Fri, Sunday) 07/03/19   [provider]  hydrochlorothiazide (HYDRODIURIL) 25 MG tablet Take 1 tablet (25 mg total) by mouth daily. 05/13/14   Fransico Meadow, PA-C  ibuprofen (ADVIL,MOTRIN) 800 MG tablet Take 1 tablet (800 mg total) by mouth 3 (three) times daily. 09/01/12   Fransico Meadow, PA-C  lisinopril (PRINIVIL,ZESTRIL) 10 MG tablet Take 1 tablet (10 mg total) by mouth daily. 05/13/14   Fransico Meadow, PA-C  metoprolol succinate (TOPROL-XL) 25 MG 24 hr tablet Take 25 mg by mouth daily. 06/20/19   [provider]  omeprazole (PRILOSEC) 40 MG capsule Take 40 mg by mouth daily. 05/18/19   [provider]  oxyCODONE-acetaminophen (PERCOCET/ROXICET) 5-325 MG per tablet Take 2 tablets by mouth every 4 (four) hours as needed for severe pain. 05/13/14   Fransico Meadow, PA-C  potassium chloride SA (K-DUR,KLOR-CON) 20 MEQ tablet Take 1 tablet (20 mEq total) by mouth daily. 08/24/14   Debby Freiberg, MD  predniSONE (DELTASONE) 10 MG tablet  06/20/19   [provider]  rosuvastatin (CRESTOR) 10 MG tablet Take 10 mg by mouth daily.    [provider]    Allergies    Nsaids, Other, Gabapentin, Hydrocodone-acetaminophen, Ketorolac, Quetiapine, Quetiapine fumarate, Ziprasidone, Asa [aspirin], Doxycycline, Geodon [ziprasidone hcl], Hydrocodone, Morphine and related, Penicillins, Prozac [fluoxetine hcl], Tetracyclines & related, Tramadol, Zoloft [sertraline hcl], Celecoxib, and Pregabalin  Review of Systems   Review of Systems  Musculoskeletal: Negative for arthralgias.  Skin: Positive for wound.  Neurological: Negative for weakness and numbness.    Physical Exam Updated Vital Signs BP (!) 174/82   Pulse 87   Temp 99.2 F (37.3 C) (Oral)   Resp 18   Ht 5' (1.524 m)   Wt 68 kg   SpO2 98%   BMI 29.29 kg/m   Physical Exam Vitals and nursing note reviewed.  Constitutional:      General:  She is not in acute distress.    Appearance: She is well-developed. She is not diaphoretic.  HENT:     Head: Normocephalic and atraumatic.  Eyes:     Conjunctiva/sclera: Conjunctivae normal.  Cardiovascular:     Rate and Rhythm: Normal rate and regular rhythm.     Pulses: Normal pulses.          Dorsalis pedis pulses are 2+ on the right side.       Posterior tibial pulses are 2+ on the right side.  Pulmonary:     Effort: Pulmonary effort is normal.  Musculoskeletal:     Cervical back: Neck supple.     Comments: Bilateral lower extremity edema the patient states is typical for her at baseline.  There  is weeping noted to the lower extremities, which the patient also states is normal for her. 8 cm crescent-shaped laceration to the right lower leg.  The wound created a horizontal wound base as well as a flap overlying the space and a partial avulsion pattern.  The skin flap is noted to be bruised, but at this time it still has sensation to light touch and pain sensation intact.  It also seems to have adequate blood flow, however, due to the shearing pattern of the wound, the skin flap may not ultimately survive. The base of the wound is able to be fully visualized.  There is some adipose tissue exposed, however, no noted bone exposure.  Hemorrhage controlled. No acute pain or tenderness noted anywhere else along the right lower leg. Full range of motion without noted difficulty or acute pain in the right ankle and knee.  Skin:    General: Skin is warm and dry.     Capillary Refill: Capillary refill takes less than 2 seconds.     Coloration: Skin is not pale.  Neurological:     Mental Status: She is alert.     Comments: Sensation to light touch grossly intact in the right lower extremity. Motor function and strength intact in the right foot, ankle, and knee.  Psychiatric:        Behavior: Behavior normal.              ED Results / Procedures / Treatments   Labs (all labs  ordered are listed, but only abnormal results are displayed) Labs Reviewed - No data to display  EKG None  Radiology No results found.  Procedures .Marland KitchenLaceration Repair  Date/Time: 07/15/2019 3:00 PM Performed by: Lorayne Bender, PA-C Authorized by: Lorayne Bender, PA-C   Consent:    Consent obtained:  Verbal   Consent given by:  Patient   Risks discussed:  Infection, need for additional repair, poor cosmetic result, pain and poor wound healing Anesthesia (see MAR for exact dosages):    Anesthesia method:  Local infiltration   Local anesthetic:  Lidocaine 2% WITH epi and bupivacaine 0.5% w/o epi Laceration details:    Location:  Leg   Leg location:  R lower leg   Length (cm):  8 Repair type:    Repair type:  Complex Exploration:    Hemostasis achieved with:  Epinephrine   Wound exploration: wound explored through full range of motion and entire depth of wound probed and visualized   Treatment:    Area cleansed with:  Betadine and saline   Amount of cleaning:  Extensive   Irrigation solution:  Sterile saline   Irrigation method:  Syringe   Debridement:  Minimal   Undermining:  Minimal Skin repair:    Repair method:  Sutures   Suture size:  3-0   Suture material:  Prolene   Suture technique:  Horizontal mattress   Number of sutures:  9 Approximation:    Approximation:  Loose Post-procedure details:    Dressing:  Non-adherent dressing and sterile dressing   Patient tolerance of procedure:  Tolerated well, no immediate complications Comments:     Patient's skin was noted to be quite fragile.  Initial attempts to suture because the suture material to pull through the patient's skin.  Benzoin was applied to the skin and Steri-Strips were laid along the end of the wound in an attempt to provide additional support and strength to the skin.  Despite this, the sutures may still tear  through the patient describes her skin.  This possibility was discussed with her. Additionally, the  patient's lower extremities are edematous and weeping.  This weeping was, of course, accelerated flowing from the wound. Where I was able, the wound was sutured to a point where the skin edges were touching.  However, there were a couple areas where I suspected applying more tension to the suture would have caused it to pull through the skin, which resulted in a more loose closure.  The wound closure in general was also closed more loosely than normal due to my suspicion that the patient's waxing and waning edema would cause additional tension to the sutures.   (including critical care time)  Medications Ordered in ED Medications  bupivacaine (MARCAINE) 0.5 % injection 10 mL (10 mLs Infiltration Given by Other 07/15/19 1439)  lidocaine-EPINEPHrine (XYLOCAINE W/EPI) 2 %-1:200000 (PF) injection 10 mL (10 mLs Infiltration Given by Other 07/15/19 1440)  oxyCODONE (Oxy IR/ROXICODONE) immediate release tablet 5 mg (5 mg Oral Given 07/15/19 1439)    ED Course  I have reviewed the triage vital signs and the nursing notes.  Pertinent labs & imaging results that were available during my care of the patient were reviewed by me and considered in my medical decision making (see chart for details).    MDM Rules/Calculators/A&P                      Patient presents with a right lower leg laceration.  She does not have signs of deeper injury, however, x-ray was still discussed with the patient.  Shared decision-making was used and the patient ultimately declined. Laceration repair was made more difficult due to the patient's edema and her fragile skin. We discussed the possibility that the overlying flap of her wound may not ultimately survive.  We also discussed she may have additional difficulty with wound healing due to her past medical history and her underlying edema. For this reason, I recommended close follow-up with her PCP.  I also provided the patient referral information for the wound care  clinic. The patient was given instructions for home care as well as return precautions. Patient voices understanding of these instructions, accepts the plan, and is comfortable with discharge.  Findings and plan of care discussed with Gara Kroner, MD.   Final Clinical Impression(s) / ED Diagnoses Final diagnoses:  Laceration of right lower extremity, initial encounter    Rx / DC Orders ED Discharge Orders    None       Layla Maw 07/15/19 1708    Margette Fast, MD 07/15/19 [phone]

## 2019-07-15 NOTE — Discharge Instructions (Signed)
  Wound Care - Laceration You may remove the bandage after 24 hours, however, it may be necessary to replace the bandage due to weeping or bleeding. Clean the wound and surrounding area gently with tap water and mild soap. Rinse well and blot dry. Do not scrub the wound, as this may cause the wound edges to come apart. You may shower, but avoid submerging the wound, such as with a bath or swimming. Clean the wound daily to prevent infection. Do not use cleaners such as hydrogen peroxide or alcohol.   There is actually a high probability that this wound may have trouble closing in a typical timeline due to your underlying edema.  There is also possibility that the overlying flap may not survive and will need to be removed.  Due to the potential challenges with healing for this wound, we have added a referral to the wound care clinic.  Your primary care provider will also be a good resource for any necessary follow-up and management.  Scar reduction: Application of a topical antibiotic ointment, such as Neosporin, after the wound has begun to close and heal well can decrease scab formation and reduce scarring. After the wound has healed and wound closures have been removed, application of ointments such as Aquaphor can also reduce scar formation.  The key to scar reduction is keeping the skin well hydrated and supple. Drinking plenty of water throughout the day (At least eight 8oz glasses of water a day) is essential to staying well hydrated.  Sun exposure: Keep the wound out of the sun. After the wound has healed, continue to protect it from the sun by wearing protective clothing or applying sunscreen.  Pain: You may use Tylenol and/or your home pain medication for pain.  Wound check: Return to the emergency department or go to your primary care provider in about 3 days for a wound check to assure proper healing.  Suture/staple removal: Return to the ED in 12-14 days to evaluate for suture removal.   Due to your past medical history,  Return to the ED sooner should the wound edges come apart or signs of infection arise, such as spreading redness, puffiness/swelling, pus draining from the wound, severe increase in pain, fever over 100.15F, or any other major issues.  For prescription assistance, may try using prescription discount sites or apps, such as goodrx.com

## 2019-08-11 ENCOUNTER — Other Ambulatory Visit: Payer: Self-pay

## 2019-08-11 ENCOUNTER — Emergency Department (HOSPITAL_BASED_OUTPATIENT_CLINIC_OR_DEPARTMENT_OTHER)
Admission: EM | Admit: 2019-08-11 | Discharge: 2019-08-11 | Disposition: A | Discharge status: Home / Self Care | Payer: Medicare Other | Attending: Emergency Medicine | Admitting: Emergency Medicine

## 2019-08-11 ENCOUNTER — Encounter (HOSPITAL_BASED_OUTPATIENT_CLINIC_OR_DEPARTMENT_OTHER): Payer: Self-pay | Admitting: *Deleted

## 2019-08-11 DIAGNOSIS — S81811D Laceration without foreign body, right lower leg, subsequent encounter: Secondary | ICD-10-CM | POA: Diagnosis present

## 2019-08-11 DIAGNOSIS — X58XXXD Exposure to other specified factors, subsequent encounter: Secondary | ICD-10-CM | POA: Diagnosis not present

## 2019-08-11 DIAGNOSIS — Z4802 Encounter for removal of sutures: Secondary | ICD-10-CM

## 2019-08-11 NOTE — Discharge Instructions (Addendum)
  Wet-to-dry dressing changes Description Your health care provider has covered your wound with a wet-to-dry dressing. With this type of dressing, a wet (or moist) gauze dressing is put on your wound and allowed to dry. Wound drainage and dead tissue can be removed when you take off the old dressing.  Follow any instructions you are given on how to change the dressing. Use this sheet as a reminder.  What to Expect at [facility] provider will tell you how often you should change your dressing at home.  As the wound heals, you should not need as much gauze or packing gauze.  Removing the Old Dressing Follow these steps to remove your dressing:  Wash your hands thoroughly with soap and warm water before and after each dressing change. Put on a pair of non-sterile gloves. Carefully remove the tape. Remove the old dressing. If it is sticking to your skin, wet it with warm water to loosen it. Remove the gauze pads or packing tape from inside your wound. Put the old dressing, packing material, and your gloves in a plastic bag. Set the bag aside.  Cleaning Your Wound Follow these steps to clean your wound:  Put on a new pair of non-sterile gloves. Use a clean, soft washcloth to gently clean your wound with warm water and soap. Your wound should not bleed much when you are cleaning it. A small amount of blood is OK. Rinse your wound with water. Gently pat it dry with a clean towel. DO NOT rub it dry. In some cases, you can even rinse the wound while showering. Check the wound for increased redness, swelling, or a bad odor. Pay attention to the color and amount of drainage from your wound. Look for drainage that has become darker or thicker. After cleaning your wound, remove your gloves and put them in the plastic bag with the old dressing and gloves. Wash your hands again.  Changing Your Dressing Follow these steps to put a new dressing on:  Put on a new pair of non-sterile gloves. Pour  saline into a clean bowl. Place gauze pads and any packing tape you will use in the bowl. Squeeze the saline from the gauze pads or packing tape until it is no longer dripping. Place the gauze pads or packing tape in your wound. Carefully fill in the wound and any spaces under the skin. Cover the wet gauze or packing tape with a large dry dressing pad. Use tape or rolled gauze to hold this dressing in place. Put all used supplies in the plastic bag. Close it securely, then put it in a second plastic bag, and close that bag securely. Put it in the trash. Wash your hands again when you are finished.  When to Call the Doctor Call your doctor if you have any of these changes around your wound:  Worsening redness More pain Swelling Bleeding It is larger or deeper It looks dried out or dark The drainage is increasing The drainage has a bad smell  Also call your doctor if:  Your temperature is 100.42F (38C), or higher, for more than 4 hours Drainage is coming from or around the wound Drainage is not decreasing after 3 to 5 days Drainage is increasing Drainage becomes thick, tan, yellow, or smells bad

## 2019-08-11 NOTE — ED Notes (Signed)
Right lower leg sutures from 1/24. States she should have had sutures removed earlier but just couldn't get here. Pt also came for pre op covd testing, thought could have done here. Lower leg with swelling, discoloration

## 2019-08-11 NOTE — ED Triage Notes (Signed)
Pt requesting suture removal. Sutures placed on 07-15-19.  Pt reports that she is also here for a covid test because she is having surgery this week.

## 2019-08-11 NOTE — ED Provider Notes (Signed)
[facility] EMERGENCY DEPARTMENT Provider Note   CSN: [id number] Arrival date & time: 08/11/19  1444     History Chief Complaint  Patient presents with  . Suture / Staple Removal    Leslie King is a 59 y.o. female.  HPI      Leslie King is a 59 y.o. female, with a history of ESRD on dialysis, COPD, bipolar, HTN, Goodpasture syndrome, chronic lower extremity edema, presenting to the ED with suture removal of right lower leg wound. Sutures were placed by myself in the ED in January 24.  She was told to return in about 12 days for suture removal.  She was also given referral to wound care center. She adds that she has been placed on chronic Bactrim to prevent infection in association with her dialysis treatments. Denies fever/chills, wound drainage,, acute lower extremity swelling, acute lower extremity color change, wound pain, or any other complaints.    Past Medical History:  Diagnosis Date  . Arthritis   . Bipolar disorder ([place])   . Bone spur of foot   . COPD (chronic obstructive pulmonary disease) ([place])   . Degenerative disorder of bone   . Endometriosis   . ESRD (end stage renal disease) on dialysis ([place])    Secondary to Goodpasture disease/Anti-GBM  . Fibromyalgia   . Goodpasture syndrome ([place])   . High cholesterol   . Hypertension     Patient Active Problem List   Diagnosis Date Noted  . Chronic back pain 01/22/2014  . Chronic neck pain 01/22/2014  . GERD (gastroesophageal reflux disease) 01/22/2014  . COPD (chronic obstructive pulmonary disease) ([place]) 01/22/2014  . Depression 01/22/2014    Past Surgical History:  Procedure Laterality Date  . APPENDECTOMY  1995  . BREAST LUMPECTOMY    . CARPAL TUNNEL RELEASE    . CESAREAN SECTION  1988  . CLAVICLE SURGERY    . KNEE SURGERY    . TUBAL LIGATION       OB History   No obstetric history on file.     Family History  Problem Relation Age of Onset  . Leukemia Mother   . Cancer  Father        Throat  . Hypertension Father   . Diabetes Maternal Grandfather   . Cancer Paternal Grandmother        Breast  . Diabetes Paternal Grandfather     Social History   Tobacco Use  . Smoking status: Former Smoker    Packs/day: 1.00    Types: Cigarettes    Quit date: 05/27/2019    Years since quitting: 0.2  . Smokeless tobacco: Never Used  Substance Use Topics  . Alcohol use: No  . Drug use: No    Home Medications Prior to Admission medications   Medication Sig Start Date End Date Taking? Authorizing Provider  albuterol (PROVENTIL HFA;VENTOLIN HFA) 108 (90 BASE) MCG/ACT inhaler Inhale 2 puffs into the lungs every 6 (six) hours as needed for wheezing.    [provider]  atorvastatin (LIPITOR) 80 MG tablet Take 80 mg by mouth daily. 05/25/19   [provider]  calcium citrate (CALCITRATE - DOSED IN MG ELEMENTAL CALCIUM) 950 (200 Ca) MG tablet Take by mouth. 06/20/19   [provider]  Cholecalciferol (VITAMIN D3) 25 MCG (1000 UT) CAPS  06/20/19   [provider]  clindamycin (CLEOCIN) 300 MG capsule Take 1 capsule (300 mg total) by mouth every 6 (six) hours. 05/03/14   Rancour,  Annie Main, MD  clopidogrel (PLAVIX) 75 MG tablet Take 75 mg by mouth daily. 05/25/19   [provider]  cyclobenzaprine (FLEXERIL) 10 MG tablet Take 1 tablet (10 mg total) by mouth 3 (three) times daily as needed for muscle spasms. 01/10/13   Molpus, John, MD  cyclophosphamide (CYTOXAN) 25 MG capsule  06/20/19   [provider]  DULoxetine (CYMBALTA) 60 MG capsule Take 60 mg by mouth daily.    [provider]  DULoxetine (CYMBALTA) 60 MG capsule Take by mouth. 10/04/17   [provider]  furosemide (LASIX) 80 MG tablet Take 1 tablet (80 mg total) by mouth See Admin Instructions. Take on non dialysis days (Mon, Wed, Fri, Sunday) 07/03/19   [provider]  hydrochlorothiazide (HYDRODIURIL) 25 MG tablet Take 1 tablet (25 mg  total) by mouth daily. 05/13/14   Fransico Meadow, PA-C  ibuprofen (ADVIL,MOTRIN) 800 MG tablet Take 1 tablet (800 mg total) by mouth 3 (three) times daily. 09/01/12   Fransico Meadow, PA-C  lisinopril (PRINIVIL,ZESTRIL) 10 MG tablet Take 1 tablet (10 mg total) by mouth daily. 05/13/14   Fransico Meadow, PA-C  lisinopril (ZESTRIL) 10 MG tablet Hold until follow-up. Previous dose: 10mg  once daily 11/20/14   [provider]  metoprolol succinate (TOPROL-XL) 25 MG 24 hr tablet Take 25 mg by mouth daily. 06/20/19   [provider]  omeprazole (PRILOSEC) 40 MG capsule Take 40 mg by mouth daily. 05/18/19   [provider]  oxyCODONE-acetaminophen (PERCOCET/ROXICET) 5-325 MG per tablet Take 2 tablets by mouth every 4 (four) hours as needed for severe pain. 05/13/14   Fransico Meadow, PA-C  potassium chloride SA (K-DUR,KLOR-CON) 20 MEQ tablet Take 1 tablet (20 mEq total) by mouth daily. 08/24/14   Debby Freiberg, MD  predniSONE (DELTASONE) 10 MG tablet  06/20/19   [provider]  rosuvastatin (CRESTOR) 10 MG tablet Take 10 mg by mouth daily.    [provider]  sulfamethoxazole-trimethoprim (BACTRIM) 400-80 MG tablet Take by mouth. 07/04/19   [provider]    Allergies    Nsaids, Other, Penicillins, Gabapentin, Hydrocodone-acetaminophen, Ketorolac, Quetiapine, Quetiapine fumarate, Ziprasidone, Asa [aspirin], Doxycycline, Geodon [ziprasidone hcl], Hydrocodone, Morphine and related, Prozac [fluoxetine hcl], Tetracyclines & related, Tramadol, Zoloft [sertraline hcl], Celecoxib, and Pregabalin  Review of Systems   Review of Systems  Constitutional: Negative for chills and fever.  Musculoskeletal: Negative for arthralgias and myalgias.  Skin: Positive for wound.    Physical Exam Updated Vital Signs BP (!) 148/66 (BP Location: Left Arm)   Pulse 75   Temp 98.5 F (36.9 C) (Oral)   Resp 18   Ht 5' (1.524 m)   Wt 71.2 kg   SpO2 98%   BMI 30.66 kg/m    Physical Exam Vitals and nursing note reviewed.  Constitutional:      General: She is not in acute distress.    Appearance: She is well-developed. She is not diaphoretic.  HENT:     Head: Normocephalic and atraumatic.  Eyes:     Conjunctiva/sclera: Conjunctivae normal.  Cardiovascular:     Rate and Rhythm: Normal rate and regular rhythm.     Pulses:          Dorsalis pedis pulses are 2+ on the right side.       Posterior tibial pulses are 2+ on the right side.  Pulmonary:     Effort: Pulmonary effort is normal.  Musculoskeletal:     Cervical back: Neck supple.  Right lower leg: Pitting Edema present.     Left lower leg: Pitting Edema present.     Comments: Lower extremity edema bilaterally that patient states is typical for her. Wound with sutures still in place on the right lower leg.  There is a yellowish tint to the tissue in the wound bed.  There is no tenderness, additional erythema, additional edema to the wound or surrounding tissues.  No flowing purulence.  Skin:    General: Skin is warm and dry.     Coloration: Skin is not pale.  Neurological:     Mental Status: She is alert.  Psychiatric:        Behavior: Behavior normal.              ED Results / Procedures / Treatments   Labs (all labs ordered are listed, but only abnormal results are displayed) Labs Reviewed - No data to display  EKG None  Radiology No results found.  Procedures .Suture Removal  Date/Time: 08/11/2019 3:15 PM Performed by: Lorayne Bender, PA-C Authorized by: Lorayne Bender, PA-C   Consent:    Consent obtained:  Verbal   Consent given by:  Patient Location:    Location:  Lower extremity   Lower extremity location:  Leg   Leg location:  R lower leg Procedure details:    Wound appearance:  Clean and nontender   Number of sutures removed:  9 Post-procedure details:    Post-removal:  No dressing applied   Patient tolerance of procedure:  Tolerated well, no immediate  complications   (including critical care time)  Medications Ordered in ED Medications - No data to display  ED Course  I have reviewed the triage vital signs and the nursing notes.  Pertinent labs & imaging results that were available during my care of the patient were reviewed by me and considered in my medical decision making (see chart for details).    MDM Rules/Calculators/A&P                      Patient presents for suture removal almost a month after sutures were placed. Sutures were removed without difficulty.  The wound does not have a typical appearance, however, I suspect this appearance favors granulation rather than purulence.  She has no surrounding signs of infection.  Despite this, due to her high risk for infection, additional antibiotics were considered, but choices beyond the patient's already prescribed Bactrim were severely limited due to her allergy list. It was again recommended that she follow-up with the wound care clinic.  She was given instructions for wet-to-dry dressings.  She stated she did not want to stay in the ED any longer and started to walk out after I communicated her instructions to her.  She states, " I am not upset or anything I just have to go." The patient was given instructions for home care as well as return precautions. Patient voices understanding of these instructions, accepts the plan, and is comfortable with discharge.    Findings and plan of care discussed with Lennice Sites, DO. Dr. Ronnald Nian and I reviewed the wound appearance together.  Final Clinical Impression(s) / ED Diagnoses Final diagnoses:  Visit for suture removal    Rx / DC Orders ED Discharge Orders    None       Layla Maw 08/11/19 [place], [place], DO 08/11/19 1635

## 2019-12-20 DEATH — deceased
